# Patient Record
Sex: Female | Born: 1985 | Race: Black or African American | Hispanic: No | Marital: Single | Smoking: Never smoker
Health system: Southern US, Community
[De-identification: ages and names within clinical notes are randomized; demographics above are authoritative.]

## PROBLEM LIST (undated history)

## (undated) DIAGNOSIS — I10 Essential (primary) hypertension: Secondary | ICD-10-CM

---

## 2010-10-29 ENCOUNTER — Inpatient Hospital Stay (HOSPITAL_COMMUNITY)
Admission: AD | Admit: 2010-10-29 | Discharge: 2010-10-29 | Disposition: A | Discharge status: Home / Self Care | Payer: Medicare Other | Source: Ambulatory Visit | Attending: Obstetrics & Gynecology | Admitting: Obstetrics & Gynecology

## 2010-10-29 DIAGNOSIS — O99891 Other specified diseases and conditions complicating pregnancy: Secondary | ICD-10-CM | POA: Insufficient documentation

## 2010-10-29 LAB — URINALYSIS, ROUTINE W REFLEX MICROSCOPIC
Bilirubin Urine: NEGATIVE
Glucose, UA: NEGATIVE mg/dL
Ketones, ur: NEGATIVE mg/dL
Protein, ur: NEGATIVE mg/dL
Urobilinogen, UA: 0.2 mg/dL (ref 0.0–1.0)

## 2010-10-29 LAB — URINE MICROSCOPIC-ADD ON

## 2011-05-07 NOTE — L&D Delivery Note (Signed)
Delivery Note At 12:58 PM a viable and healthy female was delivered via Vaginal, Spontaneous Delivery (Presentation: Left Occiput Anterior).  APGAR: 8, 9; weight 7 lb 1.4 oz (3215 g).   Placenta status: Intact, Spontaneous.  Cord: 3 vessels with loose nuchal cord present.  Anesthesia: Epidural  Episiotomy:  Lacerations: 1st degree;Perineal and labial  Suture Repair: 3.0 chromic Est. Blood Loss (mL):   Mom to postpartum.  Baby to nursery-stable.  Wayland Baik D 06/28/2011, 2:50 PM

## 2011-06-27 ENCOUNTER — Inpatient Hospital Stay (HOSPITAL_COMMUNITY)
Admission: AD | Admit: 2011-06-27 | Discharge: 2011-06-30 | DRG: 775 | Disposition: A | Discharge status: Home / Self Care | Payer: 59 | Source: Ambulatory Visit | Attending: Obstetrics and Gynecology | Admitting: Obstetrics and Gynecology

## 2011-06-27 ENCOUNTER — Encounter (HOSPITAL_COMMUNITY): Payer: Self-pay | Admitting: *Deleted

## 2011-06-27 DIAGNOSIS — O4290 Premature rupture of membranes, unspecified as to length of time between rupture and onset of labor, unspecified weeks of gestation: Principal | ICD-10-CM | POA: Diagnosis present

## 2011-06-27 DIAGNOSIS — Z22330 Carrier of Group B streptococcus: Secondary | ICD-10-CM

## 2011-06-27 DIAGNOSIS — Z3400 Encounter for supervision of normal first pregnancy, unspecified trimester: Secondary | ICD-10-CM

## 2011-06-27 DIAGNOSIS — O99892 Other specified diseases and conditions complicating childbirth: Secondary | ICD-10-CM | POA: Diagnosis present

## 2011-06-27 LAB — CBC
HCT: 32.1 % — ABNORMAL LOW (ref 36.0–46.0)
Hemoglobin: 10.5 g/dL — ABNORMAL LOW (ref 12.0–15.0)
MCHC: 32.7 g/dL (ref 30.0–36.0)
MCV: 79.1 fL (ref 78.0–100.0)
RDW: 14.9 % (ref 11.5–15.5)

## 2011-06-27 LAB — GC/CHLAMYDIA PROBE AMP, GENITAL: Chlamydia: NEGATIVE

## 2011-06-27 LAB — RUBELLA ANTIBODY, IGM: Rubella: IMMUNE

## 2011-06-27 LAB — ANTIBODY SCREEN: Antibody Screen: NEGATIVE

## 2011-06-27 LAB — HIV ANTIBODY (ROUTINE TESTING W REFLEX): HIV: NONREACTIVE

## 2011-06-27 LAB — ABO/RH: RH Type: POSITIVE

## 2011-06-27 MED ORDER — FLEET ENEMA 7-19 GM/118ML RE ENEM: 1.0000 | ENEMA | RECTAL | Status: DC | PRN

## 2011-06-27 MED ORDER — ACETAMINOPHEN 325 MG PO TABS: 650.0000 mg | ORAL_TABLET | ORAL | Status: DC | PRN

## 2011-06-27 MED ORDER — CITRIC ACID-SODIUM CITRATE 334-500 MG/5ML PO SOLN: 30.0000 mL | ORAL | Status: DC | PRN

## 2011-06-27 MED ORDER — LACTATED RINGERS IV SOLN: 500.0000 mL | INTRAVENOUS | Status: DC | PRN

## 2011-06-27 MED ORDER — LIDOCAINE HCL (PF) 1 % IJ SOLN
30.0000 mL | INTRAMUSCULAR | Status: DC | PRN
  Filled 2011-06-27: qty 30

## 2011-06-27 MED ORDER — EPHEDRINE 5 MG/ML INJ: 10.0000 mg | INTRAVENOUS | Status: DC | PRN

## 2011-06-27 MED ORDER — TERBUTALINE SULFATE 1 MG/ML IJ SOLN: 0.2500 mg | Freq: Once | INTRAMUSCULAR | Status: AC | PRN

## 2011-06-27 MED ORDER — DIPHENHYDRAMINE HCL 50 MG/ML IJ SOLN: 12.5000 mg | INTRAMUSCULAR | Status: DC | PRN

## 2011-06-27 MED ORDER — FENTANYL 2.5 MCG/ML BUPIVACAINE 1/10 % EPIDURAL INFUSION (WH - ANES)
14.0000 mL/h | INTRAMUSCULAR | Status: DC
  Administered 2011-06-28 (×4): 14 mL/h via EPIDURAL
  Filled 2011-06-27 (×5): qty 60

## 2011-06-27 MED ORDER — OXYTOCIN 20 UNITS IN LACTATED RINGERS INFUSION - SIMPLE: 125.0000 mL/h | Freq: Once | INTRAVENOUS | Status: DC

## 2011-06-27 MED ORDER — SODIUM CHLORIDE 0.9 % IJ SOLN: 3.0000 mL | INTRAMUSCULAR | Status: DC | PRN

## 2011-06-27 MED ORDER — PHENYLEPHRINE 40 MCG/ML (10ML) SYRINGE FOR IV PUSH (FOR BLOOD PRESSURE SUPPORT): 80.0000 ug | PREFILLED_SYRINGE | INTRAVENOUS | Status: DC | PRN

## 2011-06-27 MED ORDER — LACTATED RINGERS IV SOLN: 500.0000 mL | Freq: Once | INTRAVENOUS | Status: DC

## 2011-06-27 MED ORDER — OXYTOCIN 20 UNITS IN LACTATED RINGERS INFUSION - SIMPLE
1.0000 m[IU]/min | INTRAVENOUS | Status: DC
  Administered 2011-06-27: 2 m[IU]/min via INTRAVENOUS
  Administered 2011-06-28: 333 m[IU]/min via INTRAVENOUS
  Administered 2011-06-28: 4 m[IU]/min via INTRAVENOUS

## 2011-06-27 MED ORDER — OXYTOCIN BOLUS FROM INFUSION
500.0000 mL | Freq: Once | INTRAVENOUS | Status: DC
  Filled 2011-06-27: qty 500

## 2011-06-27 MED ORDER — PENICILLIN G POTASSIUM 5000000 UNITS IJ SOLR
2.5000 10*6.[IU] | INTRAVENOUS | Status: DC
  Administered 2011-06-28 (×4): 2.5 10*6.[IU] via INTRAVENOUS
  Filled 2011-06-27 (×8): qty 2.5

## 2011-06-27 MED ORDER — IBUPROFEN 600 MG PO TABS: 600.0000 mg | ORAL_TABLET | Freq: Four times a day (QID) | ORAL | Status: DC | PRN

## 2011-06-27 MED ORDER — EPHEDRINE 5 MG/ML INJ
10.0000 mg | INTRAVENOUS | Status: DC | PRN
  Filled 2011-06-27: qty 4

## 2011-06-27 MED ORDER — OXYCODONE-ACETAMINOPHEN 5-325 MG PO TABS: 1.0000 | ORAL_TABLET | ORAL | Status: DC | PRN

## 2011-06-27 MED ORDER — OXYTOCIN 20 UNITS IN LACTATED RINGERS INFUSION - SIMPLE
INTRAVENOUS | Status: AC
  Administered 2011-06-27: 2 m[IU]/min via INTRAVENOUS
  Filled 2011-06-27: qty 1000

## 2011-06-27 MED ORDER — LACTATED RINGERS IV SOLN
INTRAVENOUS | Status: DC
  Administered 2011-06-28: 09:00:00 via INTRAVENOUS

## 2011-06-27 MED ORDER — PENICILLIN G POTASSIUM 5000000 UNITS IJ SOLR
5.0000 10*6.[IU] | Freq: Once | INTRAVENOUS | Status: AC
  Administered 2011-06-27: 5 10*6.[IU] via INTRAVENOUS
  Filled 2011-06-27: qty 5

## 2011-06-27 MED ORDER — ONDANSETRON HCL 4 MG/2ML IJ SOLN: 4.0000 mg | Freq: Four times a day (QID) | INTRAMUSCULAR | Status: DC | PRN

## 2011-06-27 MED ORDER — PHENYLEPHRINE 40 MCG/ML (10ML) SYRINGE FOR IV PUSH (FOR BLOOD PRESSURE SUPPORT)
80.0000 ug | PREFILLED_SYRINGE | INTRAVENOUS | Status: DC | PRN
  Filled 2011-06-27: qty 5

## 2011-06-28 ENCOUNTER — Encounter (HOSPITAL_COMMUNITY): Payer: Self-pay | Admitting: Anesthesiology

## 2011-06-28 ENCOUNTER — Inpatient Hospital Stay (HOSPITAL_COMMUNITY): Payer: 59 | Admitting: Anesthesiology

## 2011-06-28 ENCOUNTER — Encounter (HOSPITAL_COMMUNITY): Payer: Self-pay | Admitting: *Deleted

## 2011-06-28 MED ORDER — LANOLIN HYDROUS EX OINT: TOPICAL_OINTMENT | CUTANEOUS | Status: DC | PRN

## 2011-06-28 MED ORDER — BENZOCAINE-MENTHOL 20-0.5 % EX AERO
1.0000 "application " | INHALATION_SPRAY | CUTANEOUS | Status: DC | PRN
  Administered 2011-06-28: 1 via TOPICAL

## 2011-06-28 MED ORDER — PRENATAL MULTIVITAMIN CH
1.0000 | ORAL_TABLET | Freq: Every day | ORAL | Status: DC
  Administered 2011-06-29 – 2011-06-30 (×2): 1 via ORAL
  Filled 2011-06-28 (×2): qty 1

## 2011-06-28 MED ORDER — DIBUCAINE 1 % RE OINT: 1.0000 "application " | TOPICAL_OINTMENT | RECTAL | Status: DC | PRN

## 2011-06-28 MED ORDER — WITCH HAZEL-GLYCERIN EX PADS: 1.0000 "application " | MEDICATED_PAD | CUTANEOUS | Status: DC | PRN

## 2011-06-28 MED ORDER — BENZOCAINE-MENTHOL 20-0.5 % EX AERO
INHALATION_SPRAY | CUTANEOUS | Status: AC
  Administered 2011-06-28: 1 via TOPICAL
  Filled 2011-06-28: qty 56

## 2011-06-28 MED ORDER — ZOLPIDEM TARTRATE 5 MG PO TABS: 5.0000 mg | ORAL_TABLET | Freq: Every evening | ORAL | Status: DC | PRN

## 2011-06-28 MED ORDER — SENNOSIDES-DOCUSATE SODIUM 8.6-50 MG PO TABS
2.0000 | ORAL_TABLET | Freq: Every day | ORAL | Status: DC
  Administered 2011-06-28 – 2011-06-29 (×2): 2 via ORAL

## 2011-06-28 MED ORDER — DIPHENHYDRAMINE HCL 25 MG PO CAPS: 25.0000 mg | ORAL_CAPSULE | Freq: Four times a day (QID) | ORAL | Status: DC | PRN

## 2011-06-28 MED ORDER — IBUPROFEN 600 MG PO TABS
600.0000 mg | ORAL_TABLET | Freq: Four times a day (QID) | ORAL | Status: DC
  Administered 2011-06-28 – 2011-06-30 (×8): 600 mg via ORAL
  Filled 2011-06-28 (×8): qty 1

## 2011-06-28 MED ORDER — LACTATED RINGERS IV SOLN
INTRAVENOUS | Status: DC
  Administered 2011-06-28: 09:00:00 via INTRAUTERINE

## 2011-06-28 MED ORDER — OXYCODONE-ACETAMINOPHEN 5-325 MG PO TABS: 1.0000 | ORAL_TABLET | ORAL | Status: DC | PRN

## 2011-06-28 MED ORDER — ONDANSETRON HCL 4 MG/2ML IJ SOLN: 4.0000 mg | INTRAMUSCULAR | Status: DC | PRN

## 2011-06-28 MED ORDER — SIMETHICONE 80 MG PO CHEW: 80.0000 mg | CHEWABLE_TABLET | ORAL | Status: DC | PRN

## 2011-06-28 MED ORDER — TETANUS-DIPHTH-ACELL PERTUSSIS 5-2.5-18.5 LF-MCG/0.5 IM SUSP: 0.5000 mL | Freq: Once | INTRAMUSCULAR | Status: DC

## 2011-06-28 MED ORDER — ONDANSETRON HCL 4 MG PO TABS: 4.0000 mg | ORAL_TABLET | ORAL | Status: DC | PRN

## 2011-06-28 MED ORDER — LIDOCAINE HCL (PF) 1 % IJ SOLN
INTRAMUSCULAR | Status: DC | PRN
  Administered 2011-06-28 (×2): 5 mL

## 2011-06-28 NOTE — Progress Notes (Signed)
Cervix 8cm.  Vtx 0 station with caput.  Variable decels, good variability.  Will continue TOL with pitocin.  IUPC placed.

## 2011-06-28 NOTE — Anesthesia Procedure Notes (Signed)
Epidural Patient location during procedure: OB Start time: 06/28/2011 12:26 AM  Staffing Anesthesiologist: Brayton Caves R Performed by: anesthesiologist   Preanesthetic Checklist Completed: patient identified, site marked, surgical consent, pre-op evaluation, timeout performed, IV checked, risks and benefits discussed and monitors and equipment checked  Epidural Patient position: sitting Prep: site prepped and draped and DuraPrep Patient monitoring: continuous pulse ox and blood pressure Approach: midline Injection technique: LOR air and LOR saline  Needle:  Needle type: Tuohy  Needle gauge: 17 G Needle length: 9 cm Needle insertion depth: 5 cm cm Catheter type: closed end flexible Catheter size: 19 Gauge Catheter at skin depth: 10 cm Test dose: negative  Assessment Events: blood not aspirated, injection not painful, no injection resistance, negative IV test and no paresthesia  Additional Notes Patient identified.  Risk benefits discussed including failed block, incomplete pain control, headache, nerve damage, paralysis, blood pressure changes, nausea, vomiting, reactions to medication both toxic or allergic, and postpartum back pain.  Patient expressed understanding and wished to proceed.  All questions were answered.  Sterile technique used throughout procedure and epidural site dressed with sterile barrier dressing. No paresthesia or other complications noted.The patient did not experience any signs of intravascular injection such as tinnitus or metallic taste in mouth nor signs of intrathecal spread such as rapid motor block. Please see nursing notes for vital signs.

## 2011-06-28 NOTE — Anesthesia Preprocedure Evaluation (Addendum)

## 2011-06-28 NOTE — H&P (Signed)
  Cc: Ctx/LOF HPI: 26 yo g1 @ 39+3 was seen in office earlier today c/o LOF and was confirmed SROM with + fern. Pt uncertain when SROM occurred. Also having irregular ctx. cvx 3/60/-2 per office exam. Pregnancy uncomplicated to this point. PMH: None PSH: None POBGYN: G1P0. ASCUS pap, h/o condyloma PNL: O pos, Ab neg, HIV NR, HepBsAg neg, RPR NR, generic screening WNL. GC neg, Ch neg. GBS pos PE: AFVSS cvx 2-3/60/-2 fht 140 reactive toco irreg  AP Admit PCN for GBS Pit for augmentation Epidural on request

## 2011-06-28 NOTE — Anesthesia Postprocedure Evaluation (Signed)
  Anesthesia Post-op Note  Patient: Diana Walker  Procedure(s) Performed: * No procedures listed *  Patient Location: Mother/Baby  Anesthesia Type: Epidural  Level of Consciousness: alert  and oriented  Airway and Oxygen Therapy: Patient Spontanous Breathing  Post-op Pain: mild  Post-op Assessment: Patient's Cardiovascular Status Stable and Respiratory Function Stable  Post-op Vital Signs: stable  Complications: No apparent anesthesia complications

## 2011-06-29 LAB — CBC
MCV: 80.6 fL (ref 78.0–100.0)
Platelets: 203 10*3/uL (ref 150–400)
RBC: 3.09 MIL/uL — ABNORMAL LOW (ref 3.87–5.11)
WBC: 17.3 10*3/uL — ABNORMAL HIGH (ref 4.0–10.5)

## 2011-06-29 NOTE — Progress Notes (Signed)
Post Partum Day 1 Subjective: no complaints  Objective: Blood pressure 138/82, pulse 101, temperature 98.7 F not breastfeeding.  Physical Exam:  General: alert Lochia: appropriate Uterine Fundus: firm   Basename 06/29/11 0515 06/27/11 2000  HGB 8.1* 10.5*  HCT 24.9* 32.1*    Assessment/Plan: Plan for discharge tomorrow   LOS: 2 days   Tejas Seawood D 06/29/2011, 10:41 AM

## 2011-06-30 NOTE — Discharge Instructions (Signed)
Postpartum Care After Vaginal Delivery  After you deliver your baby, you will stay in the hospital for 24 to 72 hours, unless there were problems with the labor or delivery, or you have medical problems. While you are in the hospital, you will receive help and instructions on how to care for yourself and your baby.  Your doctor will order pain medicine, in case you need it. You will have a small amount of bleeding from your vagina and should change your sanitary pad frequently. Wash your hands thoroughly with soap and water for at least 20 seconds after changing pads and using the toilet. Let the nurses know if you begin to pass blood clots or your bleeding increases. Do not flush blood clots down the toilet before having the nurse look at them, to make sure there is no placental tissue with them.  If you had an intravenous (IV), it will be removed within 24 hours, if there are no problems. The first time you get out of bed or take a shower, call the nurse to help you because you may get weak, lightheaded, or even faint. If you are breastfeeding, you may feel painful contractions of your uterus for a couple of weeks. This is normal. The contractions help your uterus get back to normal size. If you are not breastfeeding, wear a supportive bra and handle your breasts as little as possible until your milk has dried up. Hormones should not be given to dry up the breasts, because they can cause blood clots. You will be given your normal diet, unless you have diabetes or other medical problems.   The nurses may put an ice pack on your episiotomy (surgically enlarged opening), if you have one, to reduce the pain and swelling. On rare occasions, you may not be able to urinate and the nurse will need to empty your bladder with a catheter. If you had a postpartum tubal ligation ("tying tubes," female sterilization), it should not make your stay in the hospital longer.  You may have your baby in your room with you as much as  you like, unless you or the baby has a problem. Use the bassinet (basket) for the baby when going to and from the nursery. Do not carry the baby. Do not leave the postpartum area. If the mother is Rh negative (lacks a protein on the red blood cells) and the baby is Rh positive, the mother should get a Rho-gam shot to prevent Rh problems with future pregnancies.  You may be given written instructions for you and your baby, and necessary medicines, when you are discharged from the hospital. Be sure you understand and follow the instructions as advised.  HOME CARE INSTRUCTIONS   · Follow instructions and take the medicines given to you.   · Only take over-the-counter or prescription medicines for pain, discomfort, or fever as directed by your caregiver.   · Do not take aspirin, because it can cause bleeding.   · Increase your activities a little bit every day to build up your strength and endurance.   · Do not drink alcohol, especially if you are breastfeeding or taking pain medicine.   · Take your temperature twice a day and record it.   · You may have a small amount of bleeding or spotting for 2 to 4 weeks. This is normal.   · Do not use tampons or douche. Use sanitary pads.   · Try to have someone stay and help you for a   few days when you go home.   · Try to rest or take a nap when the baby is sleeping.   · If you are breastfeeding, wear a good support bra. If you are not breastfeeding, wear a supportive bra and do not stimulate your nipples.   · Eat a healthy, nutritious diet and continue to take your prenatal vitamins.   · Do not drive, do any heavy activities, or travel until your caregiver tells you it is okay.   · Do not have intercourse until your caregiver gives you permission to do so.   · Ask your caregiver when you can begin to exercise and what type of exercises to do.   · Call your caregiver if you think you are having a problem from your delivery.   · Call your pediatrician if you are having a problem  with the baby.   · Schedule your postpartum visit and keep it.   SEEK MEDICAL CARE IF:   · You have a temperature of 100° F (37.8° C) or higher.   · You have increased vaginal bleeding or are passing clots. Save any clots to show your caregiver.   · You have bloody urine or pain when you urinate.   · You have a bad smelling vaginal discharge.   · You have increasing pain or swelling on your episiotomy.   · You develop a severe headache.   · You feel depressed.   · The episiotomy is separating.   · You become dizzy or lightheaded.   · You develop a rash.   · You have a reaction or problems with your medicine.   · You have pain, redness, or swelling at the intravenous site.   SEEK IMMEDIATE MEDICAL CARE IF:   · You have chest pain.   · You develop shortness of breath.   · You pass out.   · You develop pain, with or without swelling or redness in your leg.   · You develop heavy vaginal bleeding, with or without blood clots.   · You develop stomach pain.   · You develop a bad smelling vaginal discharge.   MAKE SURE YOU:   · Understand these instructions.   · Will watch your condition.   · Will get help right away if you are not doing well or get worse.   Document Released: 02/17/2007 Document Revised: 01/02/2011 Document Reviewed: 03/01/2009  [employer]® Patient Information ©2012 [employer].

## 2011-06-30 NOTE — Discharge Summary (Signed)
Obstetric Discharge Summary Reason for Admission: induction of labor, PROM Prenatal Procedures: ultrasound Intrapartum Procedures: spontaneous vaginal delivery Postpartum Procedures: none Complications-Operative and Postpartum: 1st degree perineal and labia minora lacerations Hemoglobin  Date Value Range Status  06/29/2011 8.1* 12.0-15.0 (g/dL) Final     DELTA CHECK NOTED     REPEATED TO VERIFY     HCT  Date Value Range Status  06/29/2011 24.9* 36.0-46.0 (%) Final    Discharge Diagnoses: Term Pregnancy-delivered and PROM xuncertain hours  Discharge Information: Date: 06/30/2011 Activity: pelvic rest Diet: routine Medications: PNV and Ibuprofen Condition: improved Instructions: refer to practice specific booklet Discharge to: home Follow-up Information    Follow up with Mickel Baas, MD in 5 weeks.   Contact information:   [address] [place] [address] [phone]          Newborn Data: Live born female  Birth Weight: 7 lb 1.4 oz (3215 g) APGAR: 8, 9  Home with mother.  Kamia Insalaco D 06/30/2011, 9:17 AM

## 2011-06-30 NOTE — Progress Notes (Signed)
Post Partum Day 2 Subjective: no complaints  Objective: Blood pressure 124/72, pulse 92, temperature 97.6 F (36.4 C), temperature source Oral, resp. rate 18, height 5\' 6"  (1.676 m), weight 195 lb (88.451 kg), SpO2 99.00%, unknown if currently breastfeeding.  Physical Exam:  General: alert Lochia: appropriate Uterine Fundus: firm   Basename 06/29/11 0515 06/27/11 2000  HGB 8.1* 10.5*  HCT 24.9* 32.1*    Assessment/Plan: Discharge home   LOS: 3 days   Vanissa Strength D 06/30/2011, 9:07 AM

## 2011-07-29 ENCOUNTER — Emergency Department (HOSPITAL_COMMUNITY): Payer: 59

## 2011-07-29 ENCOUNTER — Emergency Department (HOSPITAL_COMMUNITY)
Admission: EM | Admit: 2011-07-29 | Discharge: 2011-07-30 | Disposition: A | Discharge status: Home / Self Care | Payer: 59 | Attending: Emergency Medicine | Admitting: Emergency Medicine

## 2011-07-29 ENCOUNTER — Encounter (HOSPITAL_COMMUNITY): Payer: Self-pay | Admitting: *Deleted

## 2011-07-29 DIAGNOSIS — S8990XA Unspecified injury of unspecified lower leg, initial encounter: Secondary | ICD-10-CM | POA: Insufficient documentation

## 2011-07-29 DIAGNOSIS — S83006A Unspecified dislocation of unspecified patella, initial encounter: Secondary | ICD-10-CM | POA: Insufficient documentation

## 2011-07-29 DIAGNOSIS — R269 Unspecified abnormalities of gait and mobility: Secondary | ICD-10-CM | POA: Insufficient documentation

## 2011-07-29 DIAGNOSIS — S99929A Unspecified injury of unspecified foot, initial encounter: Secondary | ICD-10-CM | POA: Insufficient documentation

## 2011-07-29 DIAGNOSIS — M25569 Pain in unspecified knee: Secondary | ICD-10-CM | POA: Insufficient documentation

## 2011-07-29 DIAGNOSIS — X500XXA Overexertion from strenuous movement or load, initial encounter: Secondary | ICD-10-CM | POA: Insufficient documentation

## 2011-07-29 MED ORDER — HYDROMORPHONE HCL PF 1 MG/ML IJ SOLN
1.0000 mg | Freq: Once | INTRAMUSCULAR | Status: AC
  Administered 2011-07-29: 1 mg via INTRAVENOUS
  Filled 2011-07-29: qty 1

## 2011-07-29 MED ORDER — IBUPROFEN 600 MG PO TABS: 600.0000 mg | ORAL_TABLET | Freq: Four times a day (QID) | ORAL | Status: AC | PRN

## 2011-07-29 MED ORDER — ONDANSETRON HCL 4 MG/2ML IJ SOLN
4.0000 mg | Freq: Once | INTRAMUSCULAR | Status: AC
  Administered 2011-07-29: 4 mg via INTRAVENOUS
  Filled 2011-07-29: qty 2

## 2011-07-29 MED ORDER — KETOROLAC TROMETHAMINE 30 MG/ML IJ SOLN
30.0000 mg | Freq: Once | INTRAMUSCULAR | Status: AC
  Administered 2011-07-29: 30 mg via INTRAVENOUS
  Filled 2011-07-29: qty 1

## 2011-07-29 MED ORDER — HYDROCODONE-ACETAMINOPHEN 5-325 MG PO TABS: 1.0000 | ORAL_TABLET | ORAL | Status: AC | PRN

## 2011-07-29 NOTE — Discharge Instructions (Signed)
Patellar Dislocation In a dislocation of the patella, the kneecap has slipped out of the groove it rides in on the front of the knee. It can usually be guided back into its normal position without much difficulty. Often it goes back into position by straightening the leg. X-rays may be taken to make sure a fracture (bone break) is not present. Your caregiver may look into your knee joint with an instrument much like a pencil sized telescope(arthroscopy).This may be done to make sure other problems are not present. Other problems include loose cartilage bodies that are not visible on x-rays. Often nothing more may be needed other than a brief period of immobilization followed by the exercises your caregiver recommends. If this becomes a recurrent (happens several times) and chronic (long lasting) problem, surgery (an operation) may be needed to prevent this. LET YOUR CAREGIVERS KNOW ABOUT:  Allergies.   Medications taken including herbs, eye drops, over the counter medications, and creams.   Use of steroids (by mouth or creams).   Previous problems with anesthetics or novocaine   Tendency toward highly flexible joints.   Possibility of pregnancy, if this applies.   History of blood clots (thrombophlebitis).   History of bleeding or blood problems.   Previous surgery.   Other health problems.   History of anesthetic problems in your family.  HOME CARE INSTRUCTIONS   You may resume normal diet and activities as directed or allowed.   Only take over-the-counter or prescription medicines for pain, discomfort, or fever as directed by your caregiver.   Use crutches as instructed.   Apply ice to the injured knee for 15 to 20 minutes, 3 to 4 times per day while awake, for 2 days. Put the ice in a plastic bag and place a thin towel between the bag of ice and your cast, splint or wrap.   If your caregiver has given you instructions for exercises and range of motion to do, make sure to do them  as instructed. This helps prevent future recurrences. Use the brace or knee immobilizer as instructed.  SEEK IMMEDIATE MEDICAL CARE IF:  There is increased pain or swelling of the knee which is not relieved with medication.   There is increasing inflammation (warmth or redness) in the knee.   There are problems with continued dislocations.   There is locking or catching of your knee.  Document Released: 01/15/2001 Document Revised: 04/11/2011 Document Reviewed: 05/09/2008 [facility] Patient Information 2012 [place], [place].   Use ibuprofen as prescribed for the next 5 days.  You may use hydrocodone as needed for increased pain relief, however do not drive within 4 hours of taking this as it will cause drowsiness.  With the knee immobilizer at all times while awake to avoid flexing the knee and potentially causing another patellar dislocation.  Call Dr. August Saucer for recheck of your injury as mentioned above.  An ice pack applied and 10 minute increments throughout the day may also help reduce pain and swelling.

## 2011-07-29 NOTE — ED Provider Notes (Signed)
History     CSN: [id number]  Arrival date & time 07/29/11  2049   First MD Initiated Contact with Patient 07/29/11 2053      Chief Complaint  Patient presents with  . Knee Injury    (Consider location/radiation/quality/duration/timing/severity/associated sxs/prior treatment) HPI Comments: Patient was standing in her bathroom this evening when she bent and twisted her right leg at the same time, felt a sudden pop, severe pain and collapsed to the floor with obvious deformity to the right knee.  Patient is a 26 y.o. female presenting with leg pain. The history is provided by the patient.  Leg Pain  The incident occurred less than 1 hour ago. The incident occurred at home. The injury mechanism was torsion. The pain is present in the right knee (She denies any other injury.  She did not hit her head and denies neck pain.). The quality of the pain is described as sharp and aching. The pain is at a severity of 10/10. The pain is severe. The pain has been constant since onset. Associated symptoms include inability to bear weight. Pertinent negatives include no numbness, no loss of sensation and no tingling. The symptoms are aggravated by activity and palpation. She has tried immobilization for the symptoms. The treatment provided no relief.    History reviewed. No pertinent past medical history.  History reviewed. No pertinent past surgical history.  History reviewed. No pertinent family history.  History  Substance Use Topics  . Smoking status: Never Smoker   . Smokeless tobacco: Never Used  . Alcohol Use: No    OB History    Grav Para Term Preterm Abortions TAB SAB Ect Mult Living   1 1 1  0 0 0 0 0 0 1      Review of Systems  Constitutional: Negative for fever.  HENT: Negative for congestion, sore throat and neck pain.   Eyes: Negative.   Respiratory: Negative for chest tightness and shortness of breath.   Cardiovascular: Negative for chest pain.  Gastrointestinal: Negative  for nausea and abdominal pain.  Genitourinary: Negative.   Musculoskeletal: Positive for arthralgias and gait problem.  Skin: Negative.  Negative for rash and wound.  Neurological: Negative for dizziness, tingling, weakness, light-headedness, numbness and headaches.  Hematological: Negative.   Psychiatric/Behavioral: Negative.     Allergies  Review of patient's allergies indicates no known allergies.  Home Medications   Current Outpatient Rx  Name Route Sig Dispense Refill  . HYDROCODONE-ACETAMINOPHEN 5-325 MG PO TABS Oral Take 1 tablet by mouth every 4 (four) hours as needed for pain. 15 tablet 0  . IBUPROFEN 600 MG PO TABS Oral Take 1 tablet (600 mg total) by mouth every 6 (six) hours as needed for pain. 20 tablet 0    BP 136/73  Pulse 68  Temp(Src) 98.9 F (37.2 C) (Oral)  Resp 18  SpO2 98%  Breastfeeding? No  Physical Exam  Nursing note and vitals reviewed. Constitutional: She is oriented to person, place, and time. She appears well-developed and well-nourished.  HENT:  Head: Normocephalic.  Eyes: Conjunctivae are normal.  Neck: Normal range of motion.  Cardiovascular: Normal rate and intact distal pulses.  Exam reveals no decreased pulses.   Pulses:      Dorsalis pedis pulses are 2+ on the right side.       Posterior tibial pulses are 2+ on the right side.  Pulmonary/Chest: Effort normal.  Musculoskeletal: She exhibits tenderness.       Right knee: She exhibits decreased  range of motion and deformity. She exhibits no effusion and no erythema. tenderness found. Lateral joint line tenderness noted.       Obvious right knee lateral deformity with dislocation of patella.   Neurological: She is alert and oriented to person, place, and time. No sensory deficit.  Skin: Skin is warm, dry and intact.    ED Course  Procedures (including critical care time)  Labs Reviewed - No data to display Dg Knee Complete 4 Views Right  07/29/2011  *RADIOLOGY REPORT*  Clinical Data:  26 year old female with right knee pain following injury.  RIGHT KNEE - COMPLETE 4+ VIEW  Comparison: None  Findings: Lateral patellar dislocation is noted. A knee effusion is present. Mild cortical deformity along the lateral femoral condyle may represent an impaction fracture. No other abnormalities identified.  IMPRESSION: Lateral patellar dislocation with possible lateral femoral condylar impaction fracture.  Small knee effusion.  Original Report Authenticated By: Rosendo Gros, M.D.     1. Patellar dislocation     Patient was given IV Dilaudid 1 mg x2, Toradol 30 mg IV x1.  Patella was reduced by extension of right knee and gentle pressure applied to patella.  Relocated easily.  Pain improved.  Knee immobilizer placed.   MDM  Patellar dislocation.        Candis Musa, PA 07/29/11 2347  Candis Musa, PA 07/29/11 [phone]

## 2011-07-29 NOTE — ED Notes (Signed)
[facility]: pt was in bathroom twisted and heard a pop from her knee. Pt then lowered herself to the ground. Pt did not hit head or pass out. Pt has obvious deformity to right knee patella is twisted laterally. Pt given of fentanyl in route and has a 20 LAC. Pt leg splinted and CNS intact below injury. Pt remains alert and oriented.

## 2011-07-30 MED ORDER — HYDROCODONE-ACETAMINOPHEN 5-325 MG PO TABS
ORAL_TABLET | ORAL | Status: AC
  Administered 2011-07-30: 1
  Filled 2011-07-30: qty 1

## 2011-07-31 NOTE — ED Provider Notes (Signed)
Medical screening examination/treatment/procedure(s) were performed by non-physician practitioner and as supervising physician I was immediately available for consultation/collaboration.   Carleene Cooper III, MD 07/31/11 [phone]

## 2012-03-04 ENCOUNTER — Ambulatory Visit: Payer: Medicare Other | Admitting: Medical

## 2012-03-06 ENCOUNTER — Ambulatory Visit (INDEPENDENT_AMBULATORY_CARE_PROVIDER_SITE_OTHER): Payer: 59 | Admitting: Family Medicine

## 2012-03-06 ENCOUNTER — Encounter: Payer: Self-pay | Admitting: Family Medicine

## 2012-03-06 VITALS — BP 128/80 | HR 66 | Ht 66.5 in | Wt 168.0 lb

## 2012-03-06 DIAGNOSIS — R079 Chest pain, unspecified: Secondary | ICD-10-CM

## 2012-03-06 DIAGNOSIS — R0781 Pleurodynia: Secondary | ICD-10-CM

## 2012-03-06 DIAGNOSIS — J069 Acute upper respiratory infection, unspecified: Secondary | ICD-10-CM

## 2012-03-06 NOTE — Progress Notes (Signed)
  Subjective:    Patient ID: Diana Walker, female    DOB: [birth year], 26 y.o.   MRN: [id number]  HPI She was seen in an urgent care approximately 9 days ago for evaluation of shortness of breath and chest pain. Chest x-ray, EKG and blood work was negative. Se was given Naprosyn. She is here for recheck. She still having some right-sided rib pain but rates it a 2/10.   Review of Systems     Objective:   Physical Exam alert and in no distress. Tympanic membranes and canals are normal. Throat is clear. Tonsils are normal. Neck is supple without adenopathy or thyromegaly. Cardiac exam shows a regular sinus rhythm without murmurs or gallops. Lungs are clear to auscultation. No chest wall tenderness. No right upper quadrant pain on palpation.        Assessment & Plan:   1. URI (upper respiratory infection)   2. Rib pain on right side    recommend she use Aleve 2 pills twice per day. If she continues to have difficulty, she is to return here for reevaluation.

## 2012-03-06 NOTE — Patient Instructions (Signed)
Switch to Aleve 2 pills twice per day to help with the discomfort

## 2012-03-12 ENCOUNTER — Encounter: Payer: Self-pay | Admitting: Family Medicine

## 2014-03-07 ENCOUNTER — Encounter: Payer: Self-pay | Admitting: Family Medicine

## 2015-12-16 ENCOUNTER — Ambulatory Visit (HOSPITAL_COMMUNITY)
Admission: EM | Admit: 2015-12-16 | Discharge: 2015-12-16 | Disposition: A | Discharge status: Home / Self Care | Payer: 59 | Attending: Family Medicine | Admitting: Family Medicine

## 2015-12-16 ENCOUNTER — Encounter (HOSPITAL_COMMUNITY): Payer: Self-pay

## 2015-12-16 DIAGNOSIS — R109 Unspecified abdominal pain: Secondary | ICD-10-CM | POA: Insufficient documentation

## 2015-12-16 DIAGNOSIS — Z202 Contact with and (suspected) exposure to infections with a predominantly sexual mode of transmission: Secondary | ICD-10-CM | POA: Diagnosis present

## 2015-12-16 LAB — POCT URINALYSIS DIP (DEVICE)
Bilirubin Urine: NEGATIVE
GLUCOSE, UA: NEGATIVE mg/dL
KETONES UR: NEGATIVE mg/dL
Leukocytes, UA: NEGATIVE
NITRITE: NEGATIVE
PROTEIN: NEGATIVE mg/dL
Specific Gravity, Urine: 1.02 (ref 1.005–1.030)
UROBILINOGEN UA: 1 mg/dL (ref 0.0–1.0)
pH: 7.5 (ref 5.0–8.0)

## 2015-12-16 LAB — POCT PREGNANCY, URINE: PREG TEST UR: NEGATIVE

## 2015-12-16 MED ORDER — METRONIDAZOLE 500 MG PO TABS
500.0000 mg | ORAL_TABLET | Freq: Two times a day (BID) | ORAL | 0 refills | Status: DC
Start: 2015-12-16 — End: 2016-02-13

## 2015-12-16 NOTE — ED Triage Notes (Signed)
Patient presents with LLQ/LRQ abdominal pain x1 week, no pressure or burning during urination, just generalized lower abdominal pain. Pt has taken Ibuprofen for pain las time taken was last night 12/15/2015. No acute distress

## 2015-12-16 NOTE — ED Provider Notes (Signed)
[facility]    CSN: [id number] Arrival date & time: 12/16/15  1428  First Provider Contact:  First MD Initiated Contact with Patient 12/16/15 1527        History   Chief Complaint Chief Complaint  Patient presents with  . Abdominal Pain    HPI Diana Walker is a 30 y.o. female.   HPI Patient presents with the above. States that she has had abdominal cramping for the last couple of weeks.  Started her period 2 august and had usual cramping but this has continued after cycle finished.  She states that she has not been sexually active for about 7 months but just had a new partner around the time of the onset of cramping.  Has had a foul vaginal odor but denies discharge, dysuria, hematuria, vaginal bleeding, fever, chills, nausea, vomiting, bowel changes.  States that she has been treated for bacterial vaginosis in the past after having sexual partners.  Not sure if something is wrong with my "pH".   History reviewed. No pertinent past medical history.  Patient Active Problem List   Diagnosis Date Noted  . Possible exposure to STD 12/16/2015    History reviewed. No pertinent surgical history.  OB History    Gravida Para Term Preterm AB Living   1 1 1  0 0 1   SAB TAB Ectopic Multiple Live Births   0 0 0 0 1       Home Medications    Prior to Admission medications   Medication Sig Start Date End Date Taking? Authorizing Provider  Norgestimate-Ethinyl Estradiol Triphasic (ORTHO TRI-CYCLEN LO) 0.18/0.215/0.25 MG-25 MCG tab Take 1 tablet by mouth daily.   Yes Historical Provider, MD  metroNIDAZOLE (FLAGYL) 500 MG tablet Take 1 tablet (500 mg total) by mouth 2 (two) times daily. 12/16/15   Naida SleightJames M Ciro Tashiro, PA-C    Family History History reviewed. No pertinent family history.  Social History Social History  Substance Use Topics  . Smoking status: Never Smoker  . Smokeless tobacco: Never Used  . Alcohol use No     Allergies   Review of patient's allergies  indicates no known allergies.   Review of Systems Review of Systems  Constitutional: Negative.   HENT: Negative.   Eyes: Negative.   Respiratory: Negative.   Cardiovascular: Negative.   Gastrointestinal: Negative.   Endocrine: Negative.   Genitourinary: Positive for dyspareunia and pelvic pain. Negative for dysuria, flank pain, frequency, genital sores, hematuria, menstrual problem, vaginal bleeding, vaginal discharge and vaginal pain.  Musculoskeletal: Negative.   Neurological: Negative.   Hematological: Negative.   Psychiatric/Behavioral: Negative.      Physical Exam Triage Vital Signs ED Triage Vitals  Enc Vitals Group     BP 12/16/15 1457 138/73     Pulse Rate 12/16/15 1457 80     Resp 12/16/15 1457 17     Temp 12/16/15 1457 98.1 F (36.7 C)     Temp Source 12/16/15 1457 Oral     SpO2 12/16/15 1457 100 %     Weight --      Height --      Head Circumference --      Peak Flow --      Pain Score 12/16/15 1500 4     Pain Loc --      Pain Edu? --      Excl. in GC? --    No data found.   Updated Vital Signs BP 138/73 (BP Location: Left Arm)  Pulse 80   Temp 98.1 F (36.7 C) (Oral)   Resp 17   LMP 12/06/2015 (Exact Date)   SpO2 100%   Visual Acuity Right Eye Distance:   Left Eye Distance:   Bilateral Distance:    Right Eye Near:   Left Eye Near:    Bilateral Near:     Physical Exam  Constitutional: She is oriented to person, place, and time. No distress.  HENT:  Head: Normocephalic and atraumatic.  Eyes: EOM are normal. Pupils are equal, round, and reactive to light.  Cardiovascular: Normal rate, regular rhythm and normal heart sounds.   Pulmonary/Chest: Effort normal.  Abdominal: She exhibits no distension and no mass. There is no tenderness. There is no rebound and no guarding.  Genitourinary:  Genitourinary Comments: Vaginal exam performed with assistant present.  There is a milky white discharge noted.  Cultures obtained.   Musculoskeletal:  Normal range of motion.  Neurological: She is oriented to person, place, and time.  Skin: Skin is warm and dry.  Psychiatric: She has a normal mood and affect.     UC Treatments / Results  Labs (all labs ordered are listed, but only abnormal results are displayed) Labs Reviewed  POCT URINALYSIS DIP (DEVICE) - Abnormal; Notable for the following:       Result Value   Hgb urine dipstick SMALL (*)    All other components within normal limits  POCT PREGNANCY, URINE  CERVICOVAGINAL ANCILLARY ONLY    EKG  EKG Interpretation None       Radiology No results found.  Procedures Procedures (including critical care time)  Medications Ordered in UC Medications - No data to display   Initial Impression / Assessment and Plan / UC Course  I have reviewed the triage vital signs and the nursing notes.  Pertinent labs & imaging results that were available during my care of the patient were reviewed by me and considered in my medical decision making (see chart for details).  Clinical Course      Final Clinical Impressions(s) / UC Diagnoses   Final diagnoses:  Possible exposure to STD    Plan: Since patient states that she has been treated for BV in the past after intercourse she was given a script for flagyl  po bid x 7 days.  We will contact her with her lab results and change treatment plan if needed.  All questions answered.      Naida Sleight, PA-C 12/16/15 [phone]

## 2015-12-18 LAB — CERVICOVAGINAL ANCILLARY ONLY
CHLAMYDIA, DNA PROBE: NEGATIVE
Neisseria Gonorrhea: NEGATIVE

## 2015-12-19 LAB — CERVICOVAGINAL ANCILLARY ONLY: WET PREP (BD AFFIRM): POSITIVE — AB

## 2015-12-20 ENCOUNTER — Telehealth (HOSPITAL_COMMUNITY): Payer: Self-pay | Admitting: Emergency Medicine

## 2015-12-20 NOTE — Telephone Encounter (Signed)
Per Dr. Dayton ScrapeMurray,  Notes Recorded by Eustace MooreLaura W Murray, MD on 12/19/2015 at 9:22 PM EDT Please let patient know that test for gardnerella (bacterial vaginosis) was positive.  [facility]  Recheck or followup with PCP, Sharlot GowdaJohn LaLonde, for further evaluation if symptoms persist. LM  Called pt and notified of recent lab results from visit 8/12 Pt ID'd properly... Reports feeling better and sx have subsided Adv pt if sx are not getting better to return Reports she only took 2 pills and stopped... Adv pt to make sure she finishes meds as Rx.  Education on safe sex given Pt verb understanding.

## 2016-02-13 ENCOUNTER — Ambulatory Visit (HOSPITAL_COMMUNITY)
Admission: EM | Admit: 2016-02-13 | Discharge: 2016-02-13 | Disposition: A | Discharge status: Home / Self Care | Payer: 59 | Attending: Emergency Medicine | Admitting: Emergency Medicine

## 2016-02-13 ENCOUNTER — Encounter (HOSPITAL_COMMUNITY): Payer: Self-pay | Admitting: *Deleted

## 2016-02-13 DIAGNOSIS — B9689 Other specified bacterial agents as the cause of diseases classified elsewhere: Secondary | ICD-10-CM | POA: Diagnosis not present

## 2016-02-13 DIAGNOSIS — N898 Other specified noninflammatory disorders of vagina: Secondary | ICD-10-CM | POA: Diagnosis present

## 2016-02-13 DIAGNOSIS — N760 Acute vaginitis: Secondary | ICD-10-CM | POA: Diagnosis not present

## 2016-02-13 DIAGNOSIS — Z8619 Personal history of other infectious and parasitic diseases: Secondary | ICD-10-CM | POA: Diagnosis not present

## 2016-02-13 LAB — POCT PREGNANCY, URINE: PREG TEST UR: NEGATIVE

## 2016-02-13 LAB — POCT URINALYSIS DIP (DEVICE)
BILIRUBIN URINE: NEGATIVE
Glucose, UA: NEGATIVE mg/dL
KETONES UR: NEGATIVE mg/dL
LEUKOCYTES UA: NEGATIVE
NITRITE: NEGATIVE
PH: 7 (ref 5.0–8.0)
Protein, ur: NEGATIVE mg/dL
Specific Gravity, Urine: 1.015 (ref 1.005–1.030)
Urobilinogen, UA: 0.2 mg/dL (ref 0.0–1.0)

## 2016-02-13 MED ORDER — METRONIDAZOLE 500 MG PO TABS: 500.0000 mg | ORAL_TABLET | Freq: Two times a day (BID) | ORAL | 0 refills | Status: DC

## 2016-02-13 NOTE — Discharge Instructions (Signed)
Take the medication as written. Give us a working phone number so that we can contact you if needed. Refrain from sexual contact until you know your results and your partner(s) are treated if necessary. Return to the ED if you get worse, have a fever >100.4, or for any concerns.   Go to www.goodrx.com to look up your medications. This will give you a list of where you can find your prescriptions at the most affordable prices.

## 2016-02-13 NOTE — ED Triage Notes (Signed)
Pt     Repots       Symptoms  Of        Vaginal   Discharge   Of     Foul  Odor        X   sev   Weeks             denys   Any  Pain he  Ambulated  To  Room   With a  Steady  Fluid  Gait        She  Reports  Has  Has   Tried  Various   Home  remeidies   Without  Any  releif

## 2016-02-13 NOTE — ED Provider Notes (Signed)
HPI  SUBJECTIVE:  Diana Walker is a 30 y.o. female who presents with  several weeks of odorous vaginal discharge.  No dysuria, urgency, frequency, cloudy or oderous urine, hematuria,  genital blisters, vaginal itching. She does report some vaginal spotting, states this is because she forgot to take several birth control pills No aggravating factors. Symptoms are better with warm baths.  No fevers, N/V, abd pain, back pain. No recent abx use, perfumed soaps / bodywashes. Pt sexually active with relatively new female  partner who is asxatic. They do not use condoms. STD's not a concern today. Similar sx before when had BV. Also has h/o trichomonas. No h/o gonorrhea chlamydia, yeast infection. No h/o syphilis, herpes, HIV. No h/o PID, ectopic pregnancy. No h/o DM. LMP last week in September. Patient is on OCPs. PMD: Dr. Jackquline DenmarkVieta Bland[facility]    History reviewed. No pertinent past medical history.  History reviewed. No pertinent surgical history.  History reviewed. No pertinent family history.  Social History  Substance Use Topics  . Smoking status: Never Smoker  . Smokeless tobacco: Never Used  . Alcohol use No    No current facility-administered medications for this encounter.   Current Outpatient Prescriptions:  .  metroNIDAZOLE (FLAGYL) 500 MG tablet, Take 1 tablet (500 mg total) by mouth 2 (two) times daily. X 7 days, Disp: 14 tablet, Rfl: 0 .  Norgestimate-Ethinyl Estradiol Triphasic (ORTHO TRI-CYCLEN LO) 0.18/0.215/0.25 MG-25 MCG tab, Take 1 tablet by mouth daily., Disp: , Rfl:   No Known Allergies   ROS  As noted in HPI.   Physical Exam  BP 138/89 (BP Location: Left Arm)   Pulse 67   Temp 99.2 F (37.3 C) (Oral)   Resp 16   LMP 01/31/2016   SpO2 100%   Constitutional: Well developed, well nourished, no acute distress Eyes:  EOMI, conjunctiva normal bilaterally HENT: Normocephalic, atraumatic,mucus membranes moist Respiratory: Normal inspiratory  effort Cardiovascular: Normal rate GI: nondistended soft, nontender. No suprapubic tenderness  back: No CVA tenderness GU: External genitalia normal.  Normal vaginal mucosa.  Normal os. Mild brownish discharge consistent with uterine bleeding from os. Clear mucoid mildly oderous vaginal discharge.  Uterus smooth,  NT. No  CMT. No  adnexal tenderness. No adnexal masses.  Chaperone present during exam skin: No rash, skin intact Musculoskeletal: no deformities Neurologic: Alert & oriented x 3, no focal neuro deficits Psychiatric: Speech and behavior appropriate   ED Course   Medications - No data to display  Orders Placed This Encounter  Procedures  . Pelvic exam    Standing Status:   Standing    Number of Occurrences:   1  . POCT urinalysis dip (device)    Standing Status:   Standing    Number of Occurrences:   1  . Pregnancy, urine POC    Standing Status:   Standing    Number of Occurrences:   1    Results for orders placed or performed during the hospital encounter of 02/13/16 (from the past 24 hour(s))  POCT urinalysis dip (device)     Status: Abnormal   Collection Time: 02/13/16  4:20 PM  Result Value Ref Range   Glucose, UA NEGATIVE NEGATIVE mg/dL   Bilirubin Urine NEGATIVE NEGATIVE   Ketones, ur NEGATIVE NEGATIVE mg/dL   Specific Gravity, Urine 1.015 1.005 - 1.030   Hgb urine dipstick MODERATE (A) NEGATIVE   pH 7.0 5.0 - 8.0   Protein, ur NEGATIVE NEGATIVE mg/dL  Urobilinogen, UA 0.2 0.0 - 1.0 mg/dL   Nitrite NEGATIVE NEGATIVE   Leukocytes, UA NEGATIVE NEGATIVE  Pregnancy, urine POC     Status: None   Collection Time: 02/13/16  4:31 PM  Result Value Ref Range   Preg Test, Ur NEGATIVE NEGATIVE   No results found.  ED Clinical Impression  BV (bacterial vaginosis)   ED Assessment/Plan  H&P most c/w  BV. Sent off GC/chlamydia, wet prep. Will not treat empirically now. Will send home with flagyl. Advised pt to refrain from sexual contact until she  knows lab  results, symptoms resolve, and partner(s) are treated if necessary. Pt provided working phone number. Follow-up with PMD as needed. Discussed labs, MDM, plan and followup with patient. Pt agrees with plan.    *This clinic note was created using Dragon dictation software. Therefore, there may be occasional mistakes despite careful proofreading.  ?    Domenick Gong, MD 02/13/16 [phone]

## 2016-02-14 LAB — CERVICOVAGINAL ANCILLARY ONLY
CHLAMYDIA, DNA PROBE: NEGATIVE
Neisseria Gonorrhea: NEGATIVE
Wet Prep (BD Affirm): POSITIVE — AB

## 2016-05-14 ENCOUNTER — Other Ambulatory Visit: Payer: Self-pay | Admitting: Obstetrics and Gynecology

## 2020-09-29 ENCOUNTER — Emergency Department (HOSPITAL_COMMUNITY): Payer: 59

## 2020-09-29 ENCOUNTER — Other Ambulatory Visit: Payer: Self-pay

## 2020-09-29 ENCOUNTER — Emergency Department (HOSPITAL_COMMUNITY)
Admission: EM | Admit: 2020-09-29 | Discharge: 2020-09-29 | Disposition: A | Discharge status: Home / Self Care | Payer: 59 | Attending: Emergency Medicine | Admitting: Emergency Medicine

## 2020-09-29 DIAGNOSIS — S61412A Laceration without foreign body of left hand, initial encounter: Secondary | ICD-10-CM | POA: Diagnosis not present

## 2020-09-29 DIAGNOSIS — S52502A Unspecified fracture of the lower end of left radius, initial encounter for closed fracture: Secondary | ICD-10-CM

## 2020-09-29 DIAGNOSIS — Z23 Encounter for immunization: Secondary | ICD-10-CM | POA: Diagnosis not present

## 2020-09-29 DIAGNOSIS — S52572A Other intraarticular fracture of lower end of left radius, initial encounter for closed fracture: Secondary | ICD-10-CM | POA: Insufficient documentation

## 2020-09-29 DIAGNOSIS — Y92410 Unspecified street and highway as the place of occurrence of the external cause: Secondary | ICD-10-CM | POA: Diagnosis not present

## 2020-09-29 DIAGNOSIS — S52612A Displaced fracture of left ulna styloid process, initial encounter for closed fracture: Secondary | ICD-10-CM | POA: Insufficient documentation

## 2020-09-29 DIAGNOSIS — S6992XA Unspecified injury of left wrist, hand and finger(s), initial encounter: Secondary | ICD-10-CM | POA: Diagnosis present

## 2020-09-29 MED ORDER — FENTANYL CITRATE (PF) 100 MCG/2ML IJ SOLN
100.0000 ug | Freq: Once | INTRAMUSCULAR | Status: AC
  Administered 2020-09-29: 100 ug via INTRAVENOUS
  Filled 2020-09-29: qty 2

## 2020-09-29 MED ORDER — OXYCODONE-ACETAMINOPHEN 5-325 MG PO TABS
1.0000 | ORAL_TABLET | Freq: Once | ORAL | Status: AC
  Administered 2020-09-29: 1 via ORAL
  Filled 2020-09-29: qty 1

## 2020-09-29 MED ORDER — MORPHINE SULFATE (PF) 4 MG/ML IV SOLN
4.0000 mg | Freq: Once | INTRAVENOUS | Status: AC
  Administered 2020-09-29: 4 mg via INTRAVENOUS
  Filled 2020-09-29: qty 1

## 2020-09-29 MED ORDER — IBUPROFEN 600 MG PO TABS: 600.0000 mg | ORAL_TABLET | Freq: Four times a day (QID) | ORAL | 0 refills | Status: AC | PRN

## 2020-09-29 MED ORDER — FENTANYL CITRATE (PF) 100 MCG/2ML IJ SOLN
75.0000 ug | Freq: Once | INTRAMUSCULAR | Status: AC
  Administered 2020-09-29: 75 ug via INTRAVENOUS
  Filled 2020-09-29: qty 2

## 2020-09-29 MED ORDER — HYDROCODONE-ACETAMINOPHEN 5-325 MG PO TABS: 1.0000 | ORAL_TABLET | Freq: Four times a day (QID) | ORAL | 0 refills | Status: DC | PRN

## 2020-09-29 MED ORDER — TETANUS-DIPHTH-ACELL PERTUSSIS 5-2.5-18.5 LF-MCG/0.5 IM SUSY
0.5000 mL | PREFILLED_SYRINGE | Freq: Once | INTRAMUSCULAR | Status: AC
  Administered 2020-09-29: 0.5 mL via INTRAMUSCULAR
  Filled 2020-09-29: qty 0.5

## 2020-09-29 MED ORDER — IBUPROFEN 600 MG PO TABS: 600.0000 mg | ORAL_TABLET | Freq: Four times a day (QID) | ORAL | 0 refills | Status: DC | PRN

## 2020-09-29 MED ORDER — LIDOCAINE HCL 2 % IJ SOLN
20.0000 mL | Freq: Once | INTRAMUSCULAR | Status: AC
  Administered 2020-09-29: 400 mg via INTRADERMAL
  Filled 2020-09-29: qty 20

## 2020-09-29 MED ORDER — MORPHINE SULFATE (PF) 4 MG/ML IV SOLN: 4.0000 mg | Freq: Once | INTRAVENOUS | Status: DC

## 2020-09-29 MED ORDER — HYDROCODONE-ACETAMINOPHEN 5-325 MG PO TABS: 1.0000 | ORAL_TABLET | Freq: Four times a day (QID) | ORAL | 0 refills | Status: AC | PRN

## 2020-09-29 NOTE — Progress Notes (Signed)
Orthopedic Tech Progress Note Patient Details:  Diana Walker 19-Aug-1985 [id number] MD called requesting for the finger trap. Did not apply just yet. Once MD is finish a VOLAR splint will be applied Patient ID: Diana Walker, female   DOB: 08/01/1985, 35 y.o.   MRN: [id number]   Donald Pore 09/29/2020, 6:49 PM
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
## 2020-09-29 NOTE — Progress Notes (Signed)
Orthopedic Tech Progress Note Patient Details:  Diana Walker 1985-06-23 [id number] MD/PA applied fingers into the finger trap, PA held break while I applied splint. Ortho Devices Type of Ortho Device: Finger trap,Cotton web roll Ortho Device/Splint Location: LUE Ortho Device/Splint Interventions: Application,Adjustment   Post Interventions Patient Tolerated: Well,Fair Instructions Provided: Care of device   Donald Pore 09/29/2020, 8:19 PM

## 2020-10-04 ENCOUNTER — Encounter (HOSPITAL_BASED_OUTPATIENT_CLINIC_OR_DEPARTMENT_OTHER): Payer: Self-pay | Admitting: Orthopedic Surgery

## 2020-10-05 ENCOUNTER — Other Ambulatory Visit: Payer: Self-pay | Admitting: Orthopedic Surgery

## 2020-10-06 NOTE — Progress Notes (Signed)

## 2020-10-10 ENCOUNTER — Other Ambulatory Visit: Payer: Self-pay

## 2020-10-10 ENCOUNTER — Encounter (HOSPITAL_BASED_OUTPATIENT_CLINIC_OR_DEPARTMENT_OTHER)
Admission: RE | Disposition: A | Discharge status: Home / Self Care | Payer: Self-pay | Source: Home / Self Care | Attending: Orthopedic Surgery

## 2020-10-10 ENCOUNTER — Ambulatory Visit (HOSPITAL_BASED_OUTPATIENT_CLINIC_OR_DEPARTMENT_OTHER): Payer: 59 | Admitting: Anesthesiology

## 2020-10-10 ENCOUNTER — Ambulatory Visit (HOSPITAL_BASED_OUTPATIENT_CLINIC_OR_DEPARTMENT_OTHER)
Admission: RE | Admit: 2020-10-10 | Discharge: 2020-10-10 | Disposition: A | Discharge status: Home / Self Care | Payer: 59 | Attending: Orthopedic Surgery | Admitting: Orthopedic Surgery

## 2020-10-10 ENCOUNTER — Encounter (HOSPITAL_BASED_OUTPATIENT_CLINIC_OR_DEPARTMENT_OTHER): Payer: Self-pay | Admitting: Orthopedic Surgery

## 2020-10-10 DIAGNOSIS — S52572A Other intraarticular fracture of lower end of left radius, initial encounter for closed fracture: Secondary | ICD-10-CM | POA: Diagnosis not present

## 2020-10-10 DIAGNOSIS — S61012A Laceration without foreign body of left thumb without damage to nail, initial encounter: Secondary | ICD-10-CM | POA: Insufficient documentation

## 2020-10-10 HISTORY — PX: OPEN REDUCTION INTERNAL FIXATION (ORIF) DISTAL RADIAL FRACTURE: SHX5989

## 2020-10-10 HISTORY — DX: Essential (primary) hypertension: I10

## 2020-10-10 HISTORY — PX: WOUND EXPLORATION: SHX6188

## 2020-10-10 LAB — POCT PREGNANCY, URINE: Preg Test, Ur: NEGATIVE

## 2020-10-10 SURGERY — OPEN REDUCTION INTERNAL FIXATION (ORIF) DISTAL RADIUS FRACTURE
Anesthesia: Regional | Site: Wrist | Laterality: Left

## 2020-10-10 MED ORDER — FENTANYL CITRATE (PF) 100 MCG/2ML IJ SOLN
INTRAMUSCULAR | Status: DC | PRN
  Administered 2020-10-10: 50 ug via INTRAVENOUS

## 2020-10-10 MED ORDER — LACTATED RINGERS IV SOLN: INTRAVENOUS | Status: DC

## 2020-10-10 MED ORDER — CEFAZOLIN SODIUM-DEXTROSE 2-4 GM/100ML-% IV SOLN
2.0000 g | INTRAVENOUS | Status: AC
  Administered 2020-10-10: 2 g via INTRAVENOUS

## 2020-10-10 MED ORDER — MIDAZOLAM HCL 2 MG/2ML IJ SOLN
2.0000 mg | Freq: Once | INTRAMUSCULAR | Status: AC
  Administered 2020-10-10: 2 mg via INTRAVENOUS

## 2020-10-10 MED ORDER — BUPIVACAINE HCL (PF) 0.5 % IJ SOLN
INTRAMUSCULAR | Status: DC | PRN
  Administered 2020-10-10: 15 mL via PERINEURAL

## 2020-10-10 MED ORDER — HYDROCODONE-ACETAMINOPHEN 5-325 MG PO TABS: ORAL_TABLET | ORAL | 0 refills | Status: AC

## 2020-10-10 MED ORDER — FENTANYL CITRATE (PF) 100 MCG/2ML IJ SOLN
25.0000 ug | INTRAMUSCULAR | Status: DC | PRN
  Administered 2020-10-10 (×2): 50 ug via INTRAVENOUS

## 2020-10-10 MED ORDER — LIDOCAINE HCL (CARDIAC) PF 100 MG/5ML IV SOSY
PREFILLED_SYRINGE | INTRAVENOUS | Status: DC | PRN
  Administered 2020-10-10: 40 mg via INTRAVENOUS

## 2020-10-10 MED ORDER — PHENYLEPHRINE 40 MCG/ML (10ML) SYRINGE FOR IV PUSH (FOR BLOOD PRESSURE SUPPORT)
PREFILLED_SYRINGE | INTRAVENOUS | Status: AC
  Filled 2020-10-10: qty 10

## 2020-10-10 MED ORDER — LIDOCAINE HCL (PF) 2 % IJ SOLN
INTRAMUSCULAR | Status: AC
  Filled 2020-10-10: qty 5

## 2020-10-10 MED ORDER — EPHEDRINE 5 MG/ML INJ
INTRAVENOUS | Status: AC
  Filled 2020-10-10: qty 10

## 2020-10-10 MED ORDER — FENTANYL CITRATE (PF) 100 MCG/2ML IJ SOLN
100.0000 ug | Freq: Once | INTRAMUSCULAR | Status: AC
  Administered 2020-10-10: 100 ug via INTRAVENOUS

## 2020-10-10 MED ORDER — FENTANYL CITRATE (PF) 100 MCG/2ML IJ SOLN
INTRAMUSCULAR | Status: AC
  Filled 2020-10-10: qty 2

## 2020-10-10 MED ORDER — ACETAMINOPHEN 10 MG/ML IV SOLN: 1000.0000 mg | Freq: Once | INTRAVENOUS | Status: DC | PRN

## 2020-10-10 MED ORDER — SUCCINYLCHOLINE CHLORIDE 200 MG/10ML IV SOSY
PREFILLED_SYRINGE | INTRAVENOUS | Status: AC
  Filled 2020-10-10: qty 10

## 2020-10-10 MED ORDER — DEXAMETHASONE SODIUM PHOSPHATE 10 MG/ML IJ SOLN
INTRAMUSCULAR | Status: AC
  Filled 2020-10-10: qty 1

## 2020-10-10 MED ORDER — BUPIVACAINE LIPOSOME 1.3 % IJ SUSP
INTRAMUSCULAR | Status: DC | PRN
  Administered 2020-10-10: 10 mL via PERINEURAL

## 2020-10-10 MED ORDER — PROMETHAZINE HCL 25 MG/ML IJ SOLN: 6.2500 mg | INTRAMUSCULAR | Status: DC | PRN

## 2020-10-10 MED ORDER — DEXAMETHASONE SODIUM PHOSPHATE 10 MG/ML IJ SOLN
INTRAMUSCULAR | Status: DC | PRN
  Administered 2020-10-10: 10 mg via INTRAVENOUS

## 2020-10-10 MED ORDER — MIDAZOLAM HCL 2 MG/2ML IJ SOLN
INTRAMUSCULAR | Status: AC
  Filled 2020-10-10: qty 2

## 2020-10-10 MED ORDER — CEFAZOLIN SODIUM-DEXTROSE 2-4 GM/100ML-% IV SOLN
INTRAVENOUS | Status: AC
  Filled 2020-10-10: qty 100

## 2020-10-10 MED ORDER — ONDANSETRON HCL 4 MG/2ML IJ SOLN
INTRAMUSCULAR | Status: AC
  Filled 2020-10-10: qty 2

## 2020-10-10 MED ORDER — MIDAZOLAM HCL 5 MG/5ML IJ SOLN
INTRAMUSCULAR | Status: DC | PRN
  Administered 2020-10-10: 2 mg via INTRAVENOUS

## 2020-10-10 MED ORDER — PROPOFOL 10 MG/ML IV BOLUS
INTRAVENOUS | Status: DC | PRN
  Administered 2020-10-10: 250 mg via INTRAVENOUS

## 2020-10-10 SURGICAL SUPPLY — 107 items
APL PRP STRL LF DISP 70% ISPRP (MISCELLANEOUS) ×3
BAG DECANTER FOR FLEXI CONT (MISCELLANEOUS) IMPLANT
BALL CTTN LRG ABS STRL LF (GAUZE/BANDAGES/DRESSINGS)
BIT DRILL 2.0 LNG QUCK RELEASE (BIT) ×3 IMPLANT
BIT DRILL QC 2.8X5 (BIT) ×5 IMPLANT
BLADE MINI RND TIP GREEN BEAV (BLADE) IMPLANT
BLADE SURG 15 STRL LF DISP TIS (BLADE) ×6 IMPLANT
BLADE SURG 15 STRL SS (BLADE) ×10
BNDG CMPR 9X4 STRL LF SNTH (GAUZE/BANDAGES/DRESSINGS) ×3
BNDG CONFORM 2 STRL LF (GAUZE/BANDAGES/DRESSINGS) IMPLANT
BNDG ELASTIC 2X5.8 VLCR STR LF (GAUZE/BANDAGES/DRESSINGS) IMPLANT
BNDG ELASTIC 3X5.8 VLCR STR LF (GAUZE/BANDAGES/DRESSINGS) ×5 IMPLANT
BNDG ESMARK 4X9 LF (GAUZE/BANDAGES/DRESSINGS) ×5 IMPLANT
BNDG GAUZE ELAST 4 BULKY (GAUZE/BANDAGES/DRESSINGS) ×5 IMPLANT
BNDG PLASTER X FAST 3X3 WHT LF (CAST SUPPLIES) ×50 IMPLANT
BNDG PLSTR 9X3 FST ST WHT (CAST SUPPLIES) ×30
CATH ROBINSON RED A/P 10FR (CATHETERS) IMPLANT
CHLORAPREP W/TINT 26 (MISCELLANEOUS) ×5 IMPLANT
CORD BIPOLAR FORCEPS 12FT (ELECTRODE) ×5 IMPLANT
COTTONBALL LRG STERILE PKG (GAUZE/BANDAGES/DRESSINGS) IMPLANT
COVER BACK TABLE 60X90IN (DRAPES) ×5 IMPLANT
COVER MAYO STAND STRL (DRAPES) ×5 IMPLANT
COVER WAND RF STERILE (DRAPES) IMPLANT
CUFF TOURN SGL QUICK 18X4 (TOURNIQUET CUFF) ×5 IMPLANT
CUFF TOURN SGL QUICK 24 (TOURNIQUET CUFF)
CUFF TRNQT CYL 24X4X16.5-23 (TOURNIQUET CUFF) IMPLANT
DECANTER SPIKE VIAL GLASS SM (MISCELLANEOUS) ×5 IMPLANT
DRAPE EXTREMITY T 121X128X90 (DISPOSABLE) ×5 IMPLANT
DRAPE OEC MINIVIEW 54X84 (DRAPES) ×5 IMPLANT
DRAPE SURG 17X23 STRL (DRAPES) ×5 IMPLANT
DRILL 2.0 LNG QUICK RELEASE (BIT) ×5
DRSG PAD ABDOMINAL 8X10 ST (GAUZE/BANDAGES/DRESSINGS) IMPLANT
GAUZE 4X4 16PLY RFD (DISPOSABLE) IMPLANT
GAUZE SPONGE 4X4 12PLY STRL (GAUZE/BANDAGES/DRESSINGS) ×5 IMPLANT
GAUZE XEROFORM 1X8 LF (GAUZE/BANDAGES/DRESSINGS) ×5 IMPLANT
GLOVE SRG 8 PF TXTR STRL LF DI (GLOVE) ×3 IMPLANT
GLOVE SURG ENC MOIS LTX SZ7.5 (GLOVE) ×5 IMPLANT
GLOVE SURG ORTHO LTX SZ8 (GLOVE) ×5 IMPLANT
GLOVE SURG UNDER POLY LF SZ8 (GLOVE) ×5
GLOVE SURG UNDER POLY LF SZ8.5 (GLOVE) ×5 IMPLANT
GOWN STRL REUS W/ TWL LRG LVL3 (GOWN DISPOSABLE) ×3 IMPLANT
GOWN STRL REUS W/TWL LRG LVL3 (GOWN DISPOSABLE) ×5
GOWN STRL REUS W/TWL XL LVL3 (GOWN DISPOSABLE) ×5 IMPLANT
GUIDEWIRE ORTHO 0.054X6 (WIRE) ×15 IMPLANT
K-WIRE .035X4 (WIRE) IMPLANT
LOOP VESSEL MAXI BLUE (MISCELLANEOUS) IMPLANT
NDL SAFETY ECLIPSE 18X1.5 (NEEDLE) IMPLANT
NEEDLE HYPO 18GX1.5 SHARP (NEEDLE)
NEEDLE HYPO 25X1 1.5 SAFETY (NEEDLE) IMPLANT
NEEDLE KEITH (NEEDLE) IMPLANT
NS IRRIG 1000ML POUR BTL (IV SOLUTION) ×5 IMPLANT
PACK BASIN DAY SURGERY FS (CUSTOM PROCEDURE TRAY) ×5 IMPLANT
PAD CAST 3X4 CTTN HI CHSV (CAST SUPPLIES) ×3 IMPLANT
PAD CAST 4YDX4 CTTN HI CHSV (CAST SUPPLIES) ×3 IMPLANT
PADDING CAST ABS 3INX4YD NS (CAST SUPPLIES)
PADDING CAST ABS 4INX4YD NS (CAST SUPPLIES) ×2
PADDING CAST ABS COTTON 3X4 (CAST SUPPLIES) IMPLANT
PADDING CAST ABS COTTON 4X4 ST (CAST SUPPLIES) ×3 IMPLANT
PADDING CAST COTTON 3X4 STRL (CAST SUPPLIES) ×5
PADDING CAST COTTON 4X4 STRL (CAST SUPPLIES) ×5
PLATE LEFT DIST RADIUS NARROW (Plate) ×5 IMPLANT
SCREW CORT FT 22X2.3XLCK HEX (Screw) ×6 IMPLANT
SCREW CORTICAL LOCKING 2.3X18M (Screw) ×5 IMPLANT
SCREW CORTICAL LOCKING 2.3X20M (Screw) ×15 IMPLANT
SCREW CORTICAL LOCKING 2.3X22M (Screw) ×10 IMPLANT
SCREW FX18X2.3XSMTH LCK NS CRT (Screw) ×3 IMPLANT
SCREW FX20X2.3XSMTH LCK NS CRT (Screw) ×9 IMPLANT
SCREW HEX 3.5X15 NLCKG STRL (Screw) ×3 IMPLANT
SCREW HEX 3.5X15MM (Screw) ×5 IMPLANT
SCREW NLCKG 13 3.5X13 HEXA (Screw) ×3 IMPLANT
SCREW NON-LOCK 3.5X13 (Screw) ×5 IMPLANT
SCREW NONLOCK HEX 3.5X12 (Screw) ×5 IMPLANT
SLEEVE SCD COMPRESS KNEE MED (STOCKING) ×5 IMPLANT
SPEAR EYE SURG WECK-CEL (MISCELLANEOUS) ×5 IMPLANT
SPLINT PLASTER CAST XFAST 3X15 (CAST SUPPLIES) ×30 IMPLANT
SPLINT PLASTER XTRA FASTSET 3X (CAST SUPPLIES) ×20
STOCKINETTE 4X48 STRL (DRAPES) ×5 IMPLANT
SUT CHROMIC 5 0 P 3 (SUTURE) IMPLANT
SUT ETHIBOND 3-0 V-5 (SUTURE) IMPLANT
SUT ETHILON 3 0 PS 1 (SUTURE) IMPLANT
SUT ETHILON 4 0 PS 2 18 (SUTURE) ×5 IMPLANT
SUT FIBERWIRE 3-0 18 TAPR NDL (SUTURE)
SUT FIBERWIRE 4-0 18 DIAM BLUE (SUTURE)
SUT FIBERWIRE 4-0 18 TAPR NDL (SUTURE)
SUT MERSILENE 2.0 SH NDLE (SUTURE) IMPLANT
SUT MERSILENE 4 0 P 3 (SUTURE) IMPLANT
SUT MNCRL AB 4-0 PS2 18 (SUTURE) IMPLANT
SUT MON AB 5-0 PS2 18 (SUTURE) IMPLANT
SUT NYLON 9 0 VRM6 (SUTURE) IMPLANT
SUT PROLENE 2 0 SH DA (SUTURE) IMPLANT
SUT PROLENE 6 0 P 1 18 (SUTURE) IMPLANT
SUT SILK 2 0 PERMA HAND 18 BK (SUTURE) IMPLANT
SUT SILK 4 0 PS 2 (SUTURE) IMPLANT
SUT SUPRAMID 4-0 (SUTURE) IMPLANT
SUT VIC AB 3-0 PS1 18 (SUTURE)
SUT VIC AB 3-0 PS1 18XBRD (SUTURE) IMPLANT
SUT VIC AB 4-0 P-3 18XBRD (SUTURE) IMPLANT
SUT VIC AB 4-0 P3 18 (SUTURE)
SUT VICRYL 4-0 PS2 18IN ABS (SUTURE) ×5 IMPLANT
SUTURE FIBERWR 3-0 18 TAPR NDL (SUTURE) IMPLANT
SUTURE FIBERWR 4-0 18 DIA BLUE (SUTURE) IMPLANT
SUTURE FIBERWR 4-0 18 TAPR NDL (SUTURE) IMPLANT
SYR BULB EAR ULCER 3OZ GRN STR (SYRINGE) ×5 IMPLANT
SYR CONTROL 10ML LL (SYRINGE) IMPLANT
TOWEL GREEN STERILE FF (TOWEL DISPOSABLE) ×10 IMPLANT
TUBE FEEDING ENTERAL 5FR 16IN (TUBING) IMPLANT
UNDERPAD 30X36 HEAVY ABSORB (UNDERPADS AND DIAPERS) ×5 IMPLANT

## 2020-10-10 NOTE — Transfer of Care (Signed)
Immediate Anesthesia Transfer of Care Note  Patient: Diana Walker  Procedure(s) Performed: OPEN REDUCTION INTERNAL FIXATION (ORIF) LEFT DISTAL RADIAL FRACTURE (Left Wrist) LACERATION EXPLORATION LEFT THUMB (Left Thumb)  Patient Location: PACU  Anesthesia Type:GA combined with regional for post-op pain  Level of Consciousness: sedated  Airway & Oxygen Therapy: Patient Spontanous Breathing and Patient connected to nasal cannula oxygen  Post-op Assessment: Report given to RN and Post -op Vital signs reviewed and stable  Post vital signs: Reviewed and stable  Last Vitals:  Vitals Value Taken Time  BP    Temp    Pulse 93 10/10/20 1727  Resp 17 10/10/20 1727  SpO2 98 % 10/10/20 1727  Vitals shown include unvalidated device data.  Last Pain:  Vitals:   10/10/20 1348  TempSrc: Oral  PainSc: 0-No pain         Complications: No complications documented.

## 2020-10-10 NOTE — Op Note (Signed)
I assisted Surgeon(s) and Role:    * Betha Loa, MD - Primary    Cindee Salt, MD - Assisting on the Procedure(s): OPEN REDUCTION INTERNAL FIXATION (ORIF) LEFT DISTAL RADIAL FRACTURE LACERATION EXPLORATION LEFT THUMB on 10/10/2020.  I provided assistance on this case as follows: Set up, approach, retraction, identification of the fracture, tenotomy of the brachial radialis, debridement of the fracture, reduction of the fracture, stabilization of the fracture, application of plates and screws to the distal radius fracture, closure of the wound, application of the dressing and splint.  Electronically signed by: Cindee Salt, MD Date: 10/10/2020 Time: 5:16 PM

## 2020-10-10 NOTE — Progress Notes (Signed)
Assisted Dr. Greg Stoltzfus with left, ultrasound guided, supraclavicular block. Side rails up, monitors on throughout procedure. See vital signs in flow sheet. Tolerated Procedure well. 
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
## 2020-10-10 NOTE — Anesthesia Procedure Notes (Signed)
Procedure Name: LMA Insertion Date/Time: 10/10/2020 3:55 PM Performed by: Ronnette Hila, CRNA Pre-anesthesia Checklist: Patient identified, Emergency Drugs available, Suction available and Patient being monitored Patient Re-evaluated:Patient Re-evaluated prior to induction Oxygen Delivery Method: Circle system utilized Preoxygenation: Pre-oxygenation with 100% oxygen Induction Type: IV induction Ventilation: Mask ventilation without difficulty LMA: LMA inserted LMA Size: 4.0 Number of attempts: 1 Airway Equipment and Method: Bite block Placement Confirmation: positive ETCO2 Tube secured with: Tape Dental Injury: Teeth and Oropharynx as per pre-operative assessment
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
## 2020-10-10 NOTE — Discharge Instructions (Addendum)
[facility] Instructions Hand Surgery  Wound Care: Keep your hand elevated above the level of your heart.  Do not allow it to dangle by your side.  Keep the dressing dry and do not remove it unless your doctor advises you to do so.  He will usually change it at the time of your post-op visit.  Moving your fingers is advised to stimulate circulation but will depend on the site of your surgery.  If you have a splint applied, your doctor will advise you regarding movement.  Activity: Do not drive or operate machinery today.  Rest today and then you may return to your normal activity and work as indicated by your physician.  Diet:  Drink liquids today or eat a light diet.  You may resume a regular diet tomorrow.    General expectations: Pain for two to three days. Fingers may become slightly swollen.  Call your doctor if any of the following occur: Severe pain not relieved by pain medication. Elevated temperature. Dressing soaked with blood. Inability to move fingers. White or bluish color to fingers. Regional Anesthesia Blocks  1. Numbness or the inability to move the "blocked" extremity may last from 3-48 hours after placement. The length of time depends on the medication injected and your individual response to the medication. If the numbness is not going away after 48 hours, call your surgeon.  2. The extremity that is blocked will need to be protected until the numbness is gone and the  Strength has returned. Because you cannot feel it, you will need to take extra care to avoid injury. Because it may be weak, you may have difficulty moving it or using it. You may not know what position it is in without looking at it while the block is in effect.  3. For blocks in the legs and feet, returning to weight bearing and walking needs to be done carefully. You will need to wait until the numbness is entirely gone and the strength has returned. You should be able to move your leg and foot  normally before you try and bear weight or walk. You will need someone to be with you when you first try to ensure you do not fall and possibly risk injury.  4. Bruising and tenderness at the needle site are common side effects and will resolve in a few days.  5. Persistent numbness or new problems with movement should be communicated to the surgeon or the [facility] [phone] [facility] [phone]).  Post Anesthesia Home Care Instructions  Activity: Get plenty of rest for the remainder of the day. A responsible individual must stay with you for 24 hours following the procedure.  For the next 24 hours, DO NOT: -Drive a car -Advertising copywriter -Drink alcoholic beverages -Take any medication unless instructed by your physician -Make any legal decisions or sign important papers.  Meals: Start with liquid foods such as gelatin or soup. Progress to regular foods as tolerated. Avoid greasy, spicy, heavy foods. If nausea and/or vomiting occur, drink only clear liquids until the nausea and/or vomiting subsides. Call your physician if vomiting continues.  Special Instructions/Symptoms: Your throat may feel dry or sore from the anesthesia or the breathing tube placed in your throat during surgery. If this causes discomfort, gargle with warm salt water. The discomfort should disappear within 24 hours.  If you had a scopolamine patch placed behind your ear for the management of post- operative nausea and/or vomiting:  1.  The medication in the patch is effective for 72 hours, after which it should be removed.  Wrap patch in a tissue and discard in the trash. Wash hands thoroughly with soap and water. 2. You may remove the patch earlier than 72 hours if you experience unpleasant side effects which may include dry mouth, dizziness or visual disturbances. 3. Avoid touching the patch. Wash your hands with soap and water after contact with the patch.    Information for  Discharge Teaching: EXPAREL (bupivacaine liposome injectable suspension)   Your surgeon or anesthesiologist gave you EXPAREL(bupivacaine) to help control your pain after surgery.   EXPAREL is a local anesthetic that provides pain relief by numbing the tissue around the surgical site.  EXPAREL is designed to release pain medication over time and can control pain for up to 72 hours.  Depending on how you respond to EXPAREL, you may require less pain medication during your recovery.  Possible side effects:  Temporary loss of sensation or ability to move in the area where bupivacaine was injected.  Nausea, vomiting, constipation  Rarely, numbness and tingling in your mouth or lips, lightheadedness, or anxiety may occur.  Call your doctor right away if you think you may be experiencing any of these sensations, or if you have other questions regarding possible side effects.  Follow all other discharge instructions given to you by your surgeon or nurse. Eat a healthy diet and drink plenty of water or other fluids.  If you return to the hospital for any reason within 96 hours following the administration of EXPAREL, it is important for health care providers to know that you have received this anesthetic. A teal colored band has been placed on your arm with the date, time and amount of EXPAREL you have received in order to alert and inform your health care providers. Please leave this armband in place for the full 96 hours following administration, and then you may remove the band.

## 2020-10-10 NOTE — Anesthesia Procedure Notes (Signed)
Anesthesia Regional Block: Supraclavicular block   Pre-Anesthetic Checklist: ,, timeout performed, Correct Patient, Correct Site, Correct Laterality, Correct Procedure, Correct Position, site marked, Risks and benefits discussed,  Surgical consent,  Pre-op evaluation,  At surgeon's request and post-op pain management  Laterality: Left  Prep: Dura Prep       Needles:  Injection technique: Single-shot  Needle Type: Echogenic Stimulator Needle     Needle Length: 5cm  Needle Gauge: 20     Additional Needles:   Procedures:,,,, ultrasound used (permanent image in chart),,,,  Narrative:  Start time: 10/10/2020 2:54 PM End time: 10/10/2020 2:58 PM Injection made incrementally with aspirations every 5 mL.  Performed by: Personally  Anesthesiologist: Atilano Median, DO  Additional Notes: Patient identified. Risks/Benefits/Options discussed with patient including but not limited to bleeding, infection, nerve damage, failed block, incomplete pain control. Patient expressed understanding and wished to proceed. All questions were answered. Sterile technique was used throughout the entire procedure. Please see nursing notes for vital signs. Aspirated in 5cc intervals with injection for negative confirmation. Patient was given instructions on fall risk and not to get out of bed. All questions and concerns addressed with instructions to call with any issues or inadequate analgesia.

## 2020-10-11 NOTE — Anesthesia Postprocedure Evaluation (Signed)
Anesthesia Post Note  Patient: Diana Walker  Procedure(s) Performed: OPEN REDUCTION INTERNAL FIXATION (ORIF) LEFT DISTAL RADIAL FRACTURE (Left Wrist) LACERATION EXPLORATION LEFT THUMB (Left Thumb)     Patient location during evaluation: PACU Anesthesia Type: Regional and General Level of consciousness: awake and alert Pain management: pain level controlled Vital Signs Assessment: post-procedure vital signs reviewed and stable Respiratory status: spontaneous breathing, nonlabored ventilation, respiratory function stable and patient connected to nasal cannula oxygen Cardiovascular status: blood pressure returned to baseline and stable Postop Assessment: no apparent nausea or vomiting Anesthetic complications: no   No complications documented.  Last Vitals:  Vitals:   10/10/20 1800 10/10/20 1815  BP: (!) 167/107 (!) 147/105  Pulse: 78 92  Resp: 12 16  Temp:  (!) 36.3 C  SpO2: 96% 100%    Last Pain:  Vitals:   10/10/20 1815  TempSrc:   PainSc: 0-No pain                 Earl Lites P Himani Corona

## 2020-10-12 ENCOUNTER — Encounter (HOSPITAL_BASED_OUTPATIENT_CLINIC_OR_DEPARTMENT_OTHER): Payer: Self-pay | Admitting: Orthopedic Surgery

## 2021-09-04 ENCOUNTER — Encounter (HOSPITAL_COMMUNITY): Payer: Self-pay | Admitting: Emergency Medicine

## 2021-09-04 ENCOUNTER — Emergency Department (HOSPITAL_COMMUNITY)
Admission: EM | Admit: 2021-09-04 | Discharge: 2021-09-04 | Disposition: A | Discharge status: Home / Self Care | Payer: 59 | Attending: Emergency Medicine | Admitting: Emergency Medicine

## 2021-09-04 ENCOUNTER — Other Ambulatory Visit: Payer: Self-pay

## 2021-09-04 DIAGNOSIS — T63301A Toxic effect of unspecified spider venom, accidental (unintentional), initial encounter: Secondary | ICD-10-CM | POA: Insufficient documentation

## 2021-09-04 DIAGNOSIS — R079 Chest pain, unspecified: Secondary | ICD-10-CM | POA: Diagnosis not present

## 2021-09-04 DIAGNOSIS — X58XXXA Exposure to other specified factors, initial encounter: Secondary | ICD-10-CM

## 2021-09-04 DIAGNOSIS — F419 Anxiety disorder, unspecified: Secondary | ICD-10-CM | POA: Insufficient documentation

## 2021-09-04 DIAGNOSIS — R202 Paresthesia of skin: Secondary | ICD-10-CM | POA: Insufficient documentation

## 2021-09-04 DIAGNOSIS — I10 Essential (primary) hypertension: Secondary | ICD-10-CM | POA: Insufficient documentation

## 2021-09-04 DIAGNOSIS — R0602 Shortness of breath: Secondary | ICD-10-CM | POA: Insufficient documentation

## 2021-09-04 NOTE — ED Triage Notes (Signed)
Patient BIB [facility] with complaints of getting spider "juice" on her while killing a spider. Pt worried that this caused some kind of reaction from it. Initially having some SHOB and chest tightness. Felt anxious upon this happening. Pt NAD at this time.  ?

## 2021-09-04 NOTE — Discharge Instructions (Signed)
You were seen in the emergency department today for coming in contact with a spider.  Please return for any worsening concerns. ?

## 2021-09-04 NOTE — ED Provider Notes (Signed)
?[facility] EMERGENCY DEPARTMENT ?Provider Note ? ? ?CSN: [id number] ?Arrival date & time: 09/04/21  1541 ? ?  ? ?History ? ?Chief Complaint  ?Patient presents with  ? Insect Bite  ? ? ?Diana Walker is a 36 y.o. female. With past medical history of hypertension who presents to the emergency department with possible insect bite. ? ?States that just prior to arrival patient was in the kitchen cleaning when she noticed a spider fall from the ceiling.  She states that it was black with a red spot.  She states that she stomped on it and killed it.  She states that after that she wiped it up and as she was wiping she felt a tingling sensation in her arm and then began having chest pain, shortness of breath and severe anxiety.  She states that the symptoms have subsided but she was concerned about a reaction to the spider. ? ?HPI ? ?  ? ?Home Medications ?Prior to Admission medications   ?Medication Sig Start Date End Date Taking? Authorizing Provider  ?HYDROcodone-acetaminophen (NORCO) 5-325 MG tablet 1-2 tabs po q6 hours prn pain 10/10/20   Betha Loa, MD  ?ibuprofen (ADVIL) 600 MG tablet Take 1 tablet (600 mg total) by mouth every 6 (six) hours as needed. 09/29/20   Couture, Cortni S, PA-C  ?Norgestimate-Eth Estradiol (MILI PO) Take by mouth.    [provider]  ?   ? ?Allergies    ?Patient has no known allergies.   ? ?Review of Systems   ?Review of Systems  ?Psychiatric/Behavioral:  The patient is nervous/anxious.   ?All other systems reviewed and are negative. ? ?Physical Exam ?Updated Vital Signs ?BP 135/87 (BP Location: Right Arm)   Pulse 100   Temp 98 ?F (36.7 ?C) (Oral)   Resp 16   SpO2 100%  ?Physical Exam ?Vitals and nursing note reviewed.  ?Constitutional:   ?   General: She is not in acute distress. ?   Appearance: Normal appearance. She is normal weight. She is not ill-appearing or toxic-appearing.  ?HENT:  ?   Head: Normocephalic and atraumatic.  ?   Mouth/Throat:  ?   Mouth:  Mucous membranes are moist.  ?   Pharynx: Oropharynx is clear.  ?Eyes:  ?   General: No scleral icterus. ?   Extraocular Movements: Extraocular movements intact.  ?Cardiovascular:  ?   Rate and Rhythm: Normal rate and regular rhythm.  ?   Pulses: Normal pulses.  ?Pulmonary:  ?   Effort: Pulmonary effort is normal.  ?   Breath sounds: Normal breath sounds.  ?Abdominal:  ?   General: Bowel sounds are normal.  ?   Palpations: Abdomen is soft.  ?Musculoskeletal:     ?   General: Normal range of motion.  ?   Cervical back: Neck supple.  ?Skin: ?   General: Skin is warm and dry.  ?   Capillary Refill: Capillary refill takes less than 2 seconds.  ?   Findings: No erythema or rash.  ?   Comments: No evidence of bite.  No erythema, rash, target or halo lesion.  ?Neurological:  ?   General: No focal deficit present.  ?   Mental Status: She is alert and oriented to person, place, and time. Mental status is at baseline.  ?   Sensory: No sensory deficit.  ?   Motor: No weakness.  ?Psychiatric:     ?   Mood and Affect: Mood normal.     ?  Behavior: Behavior normal.     ?   Thought Content: Thought content normal.     ?   Judgment: Judgment normal.  ? ? ?ED Results / Procedures / Treatments   ?Labs ?(all labs ordered are listed, but only abnormal results are displayed) ?Labs Reviewed - No data to display ? ?EKG ?None ? ?Radiology ?No results found. ? ?Procedures ?Procedures  ? ? ?Medications Ordered in ED ?Medications - No data to display ? ?ED Course/ Medical Decision Making/ A&P ?  ?                        ?Medical Decision Making ?36 year old female who is presented to the emergency department for coming in contact with a spider. ?Not actually bit by the spider.  No evidence of bite or allergic reaction. ?Physical exam unremarkable ?Likely anxiety related to the event ? ?Does not require labs, imaging, EKG, cardiac monitoring or medications.  No social determinants of health identified that need to be addressed at this time.   I have reviewed her medical record. ?Feel that she is safe for discharge.  Given return precautions should she begin to have worsening symptoms. She verbalizes understanding. States she wanted to ensure how she should clean her floor from where she killed the spider. Discharge.  ?Final Clinical Impression(s) / ED Diagnoses ?Final diagnoses:  ?Contact with spider as cause of accidental injury  ? ? ?Rx / DC Orders ?ED Discharge Orders   ? ? None  ? ?  ? ? ?  ?Cristopher Peru, PA-C ?09/04/21 1857 ? ?  ?Melene Plan, DO ?09/04/21 1900 ? ?

## 2021-09-04 NOTE — ED Triage Notes (Signed)
Pt states spider dropped from ceiling, she killed it on the floor and then when she picked it up with a paper towel she started feeling shob.  ?

## 2021-09-07 ENCOUNTER — Encounter (HOSPITAL_COMMUNITY): Payer: Self-pay

## 2021-09-07 ENCOUNTER — Ambulatory Visit (HOSPITAL_COMMUNITY)
Admission: EM | Admit: 2021-09-07 | Discharge: 2021-09-07 | Disposition: A | Discharge status: Home / Self Care | Payer: 59 | Attending: Physician Assistant | Admitting: Physician Assistant

## 2021-09-07 DIAGNOSIS — R002 Palpitations: Secondary | ICD-10-CM | POA: Diagnosis present

## 2021-09-07 LAB — CBC WITH DIFFERENTIAL/PLATELET
Abs Immature Granulocytes: 0.04 10*3/uL (ref 0.00–0.07)
Basophils Absolute: 0 10*3/uL (ref 0.0–0.1)
Basophils Relative: 0 %
Eosinophils Absolute: 0.1 10*3/uL (ref 0.0–0.5)
Eosinophils Relative: 1 %
HCT: 37.9 % (ref 36.0–46.0)
Hemoglobin: 12.4 g/dL (ref 12.0–15.0)
Immature Granulocytes: 0 %
Lymphocytes Relative: 37 %
Lymphs Abs: 4 10*3/uL (ref 0.7–4.0)
MCH: 29.2 pg (ref 26.0–34.0)
MCHC: 32.7 g/dL (ref 30.0–36.0)
MCV: 89.4 fL (ref 80.0–100.0)
Monocytes Absolute: 0.8 10*3/uL (ref 0.1–1.0)
Monocytes Relative: 7 %
Neutro Abs: 6 10*3/uL (ref 1.7–7.7)
Neutrophils Relative %: 55 %
Platelets: 291 10*3/uL (ref 150–400)
RBC: 4.24 MIL/uL (ref 3.87–5.11)
RDW: 13.2 % (ref 11.5–15.5)
WBC: 11 10*3/uL — ABNORMAL HIGH (ref 4.0–10.5)
nRBC: 0 % (ref 0.0–0.2)

## 2021-09-07 LAB — POCT URINALYSIS DIPSTICK, ED / UC
Bilirubin Urine: NEGATIVE
Glucose, UA: NEGATIVE mg/dL
Ketones, ur: NEGATIVE mg/dL
Leukocytes,Ua: NEGATIVE
Nitrite: NEGATIVE
Protein, ur: NEGATIVE mg/dL
Specific Gravity, Urine: 1.015 (ref 1.005–1.030)
Urobilinogen, UA: 0.2 mg/dL (ref 0.0–1.0)
pH: 6.5 (ref 5.0–8.0)

## 2021-09-07 LAB — COMPREHENSIVE METABOLIC PANEL
ALT: 15 U/L (ref 0–44)
AST: 24 U/L (ref 15–41)
Albumin: 4.3 g/dL (ref 3.5–5.0)
Alkaline Phosphatase: 81 U/L (ref 38–126)
Anion gap: 8 (ref 5–15)
BUN: 11 mg/dL (ref 6–20)
CO2: 27 mmol/L (ref 22–32)
Calcium: 9.3 mg/dL (ref 8.9–10.3)
Chloride: 104 mmol/L (ref 98–111)
Creatinine, Ser: 0.97 mg/dL (ref 0.44–1.00)
GFR, Estimated: >60 mL/min (ref >60)
Glucose, Bld: 83 mg/dL (ref 70–99)
Potassium: 3.8 mmol/L (ref 3.5–5.1)
Sodium: 139 mmol/L (ref 135–145)
Total Bilirubin: 0.3 mg/dL (ref 0.3–1.2)
Total Protein: 8.2 g/dL — ABNORMAL HIGH (ref 6.5–8.1)

## 2021-09-07 LAB — TSH: TSH: 0.868 u[IU]/mL (ref 0.350–4.500)

## 2021-09-07 LAB — POC URINE PREG, ED: Preg Test, Ur: NEGATIVE
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
## 2021-09-07 NOTE — Discharge Instructions (Signed)
Your EKG looked good today.  Your urine showed no evidence of infection.  I will contact you with your lab results if anything is abnormal.  Please make sure you rest and drink plenty of fluid.  Given you are having these episodes of heart racing it may be worthwhile to follow-up with a cardiologist if they continue.  Please call to schedule an appointment.  If you have any ongoing heart racing, shortness of breath, chest pain, lightheadedness, passing out you need to go to the emergency room immediately. ?

## 2021-09-07 NOTE — ED Triage Notes (Signed)
Pt reports left arm pain and heart fluttering today. She has a h/o hypertension and is currently taking lisinopril. ?

## 2021-10-26 ENCOUNTER — Encounter (HOSPITAL_COMMUNITY): Payer: Self-pay | Admitting: Emergency Medicine

## 2021-10-26 ENCOUNTER — Ambulatory Visit (HOSPITAL_COMMUNITY)
Admission: EM | Admit: 2021-10-26 | Discharge: 2021-10-26 | Disposition: A | Discharge status: Home / Self Care | Payer: 59 | Attending: Physician Assistant | Admitting: Physician Assistant

## 2021-10-26 DIAGNOSIS — B3731 Acute candidiasis of vulva and vagina: Secondary | ICD-10-CM | POA: Diagnosis not present

## 2021-10-26 DIAGNOSIS — N760 Acute vaginitis: Secondary | ICD-10-CM | POA: Diagnosis not present

## 2021-10-26 DIAGNOSIS — R350 Frequency of micturition: Secondary | ICD-10-CM | POA: Insufficient documentation

## 2021-10-26 LAB — POCT URINALYSIS DIPSTICK, ED / UC
Bilirubin Urine: NEGATIVE
Glucose, UA: NEGATIVE mg/dL
Ketones, ur: NEGATIVE mg/dL
Leukocytes,Ua: NEGATIVE
Nitrite: NEGATIVE
Protein, ur: NEGATIVE mg/dL
Specific Gravity, Urine: 1.02 (ref 1.005–1.030)
Urobilinogen, UA: 0.2 mg/dL (ref 0.0–1.0)
pH: 7 (ref 5.0–8.0)

## 2021-10-26 MED ORDER — FLUCONAZOLE 150 MG PO TABS: 150.0000 mg | ORAL_TABLET | Freq: Every day | ORAL | 0 refills | Status: AC

## 2021-10-28 LAB — URINE CULTURE: Culture: <10000 — AB

## 2021-10-29 LAB — CERVICOVAGINAL ANCILLARY ONLY
Bacterial Vaginitis (gardnerella): POSITIVE — AB
Candida Glabrata: NEGATIVE
Candida Vaginitis: NEGATIVE
Chlamydia: NEGATIVE
Comment: NEGATIVE
Comment: NEGATIVE
Comment: NEGATIVE
Comment: NEGATIVE
Comment: NEGATIVE
Comment: NORMAL
Neisseria Gonorrhea: NEGATIVE
Trichomonas: NEGATIVE

## 2021-10-30 ENCOUNTER — Telehealth (HOSPITAL_COMMUNITY): Payer: Self-pay | Admitting: Emergency Medicine

## 2021-10-30 MED ORDER — METRONIDAZOLE 500 MG PO TABS: 500.0000 mg | ORAL_TABLET | Freq: Two times a day (BID) | ORAL | 0 refills | Status: DC

## 2021-10-31 ENCOUNTER — Telehealth (HOSPITAL_COMMUNITY): Payer: Self-pay | Admitting: Emergency Medicine

## 2021-10-31 MED ORDER — METRONIDAZOLE 0.75 % VA GEL: 1.0000 | Freq: Every day | VAGINAL | 0 refills | Status: AC

## 2021-10-31 NOTE — Telephone Encounter (Signed)
Patient preferred Metrogel, resending

## 2022-12-09 IMAGING — DX DG HAND COMPLETE 3+V*L*
3 series · 3 of 3 positions shown · non-contrast
Comparison: None.

CLINICAL DATA: 34-year-old female with left wrist pain.

EXAM:
LEFT WRIST - COMPLETE 3+ VIEW; LEFT HAND - COMPLETE 3+ VIEW

[hand ap]
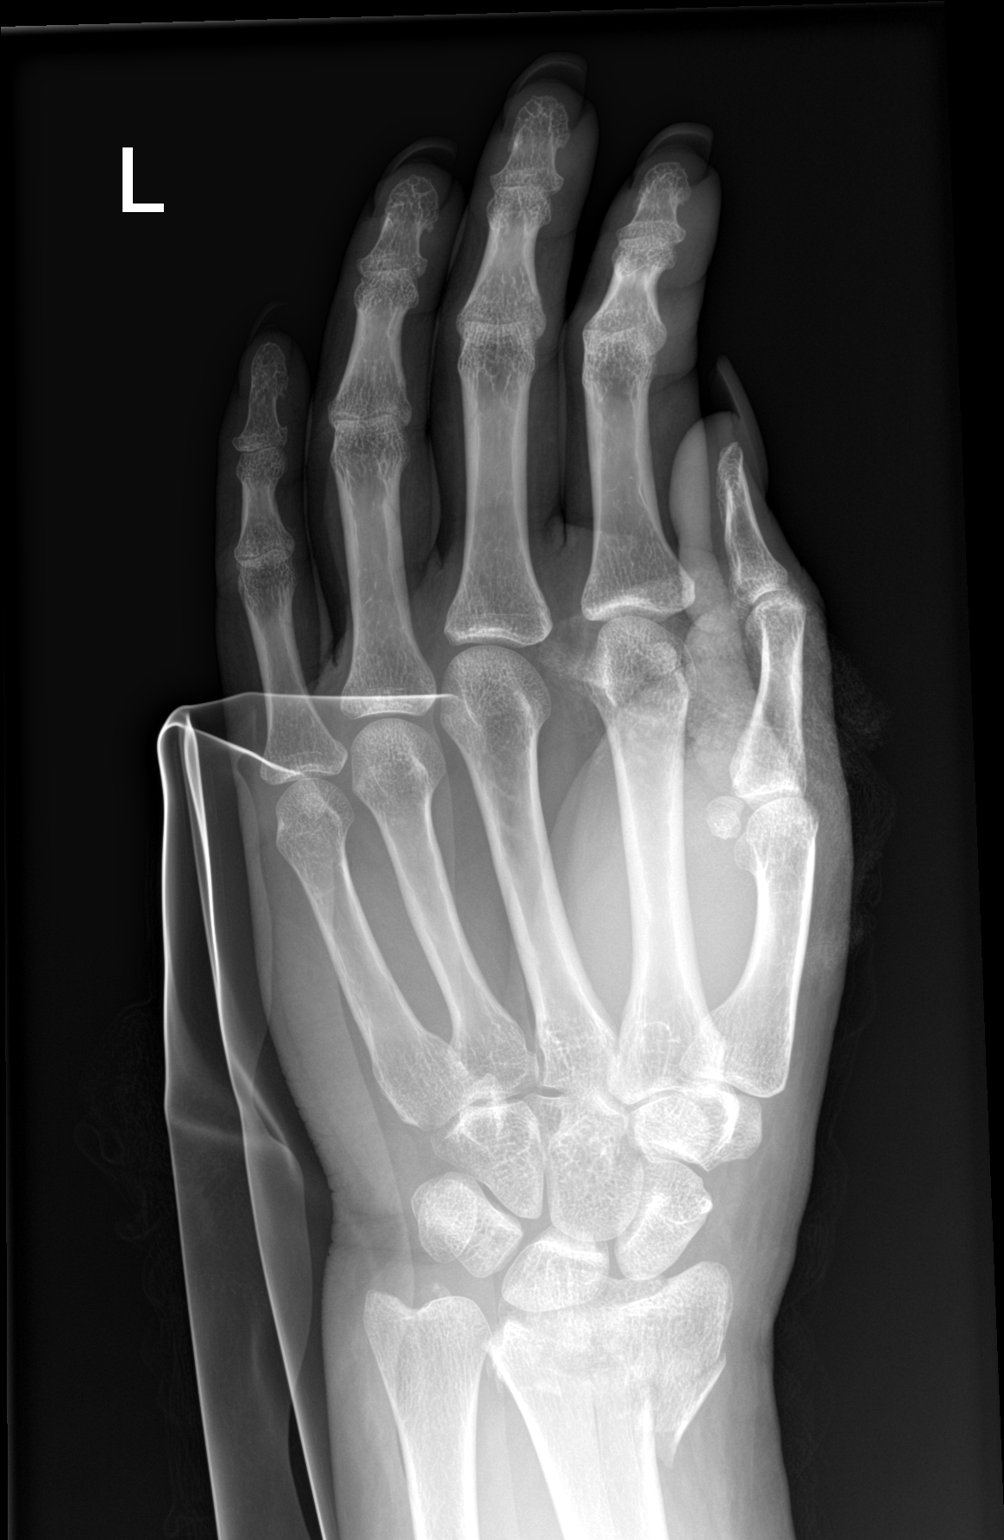

[hand obl]
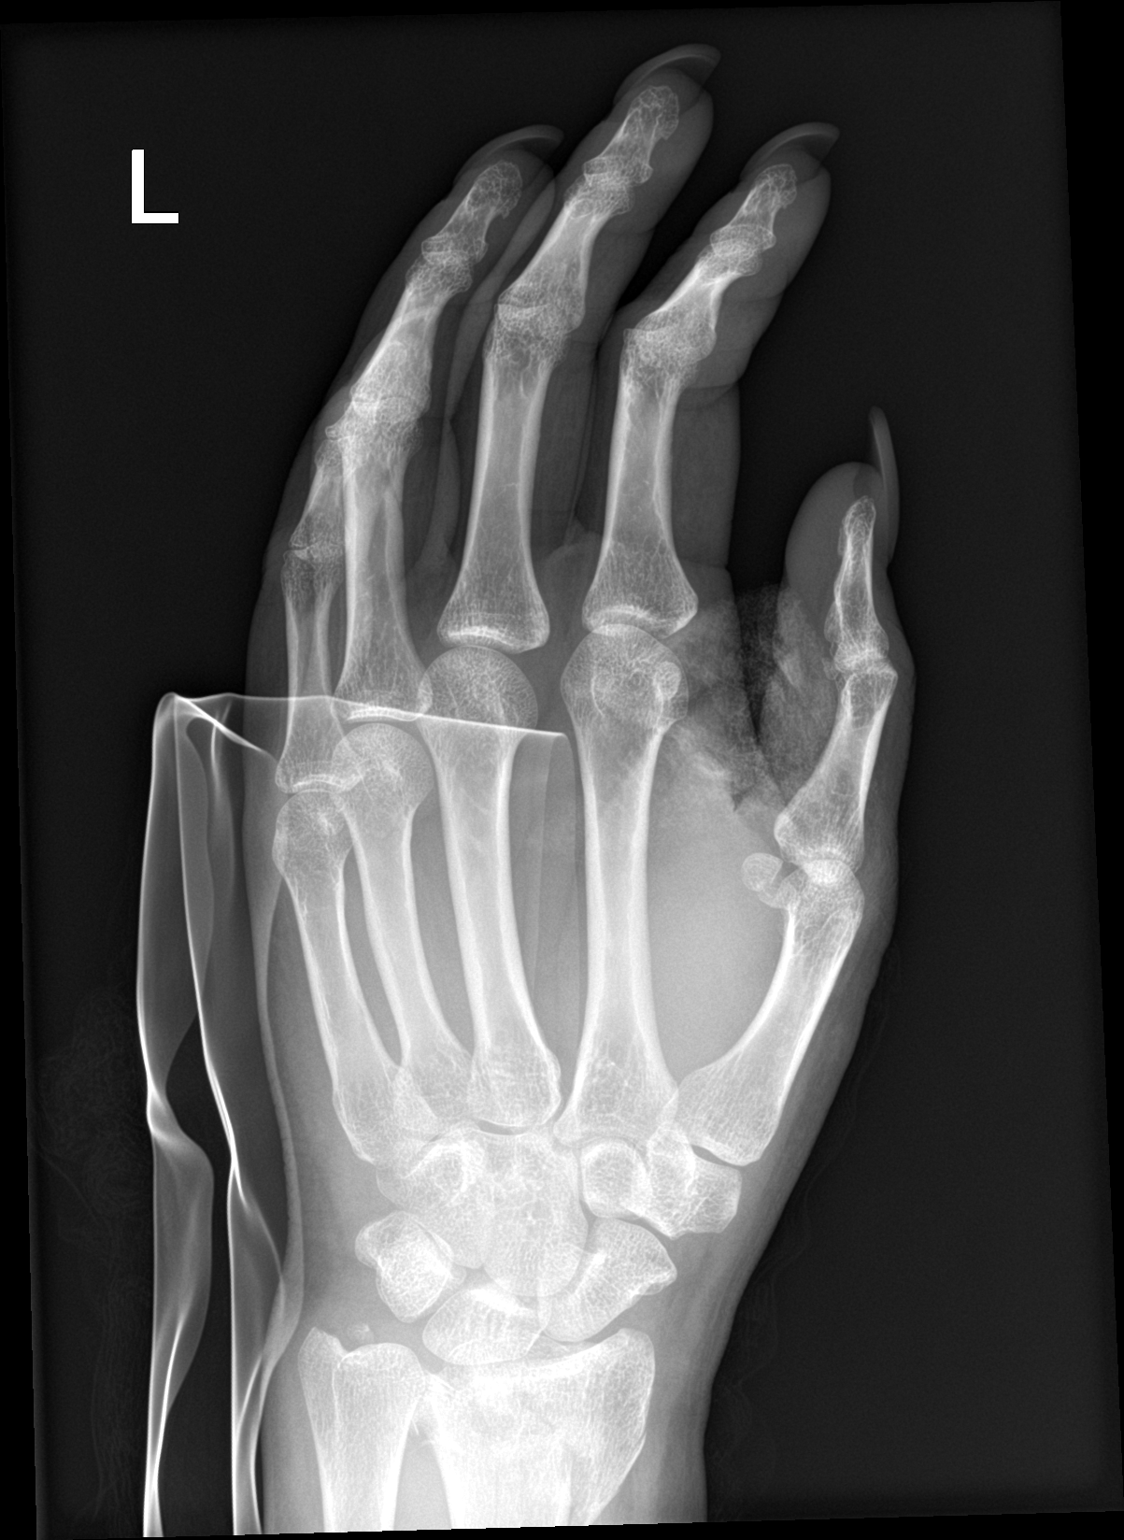

[hand lat]
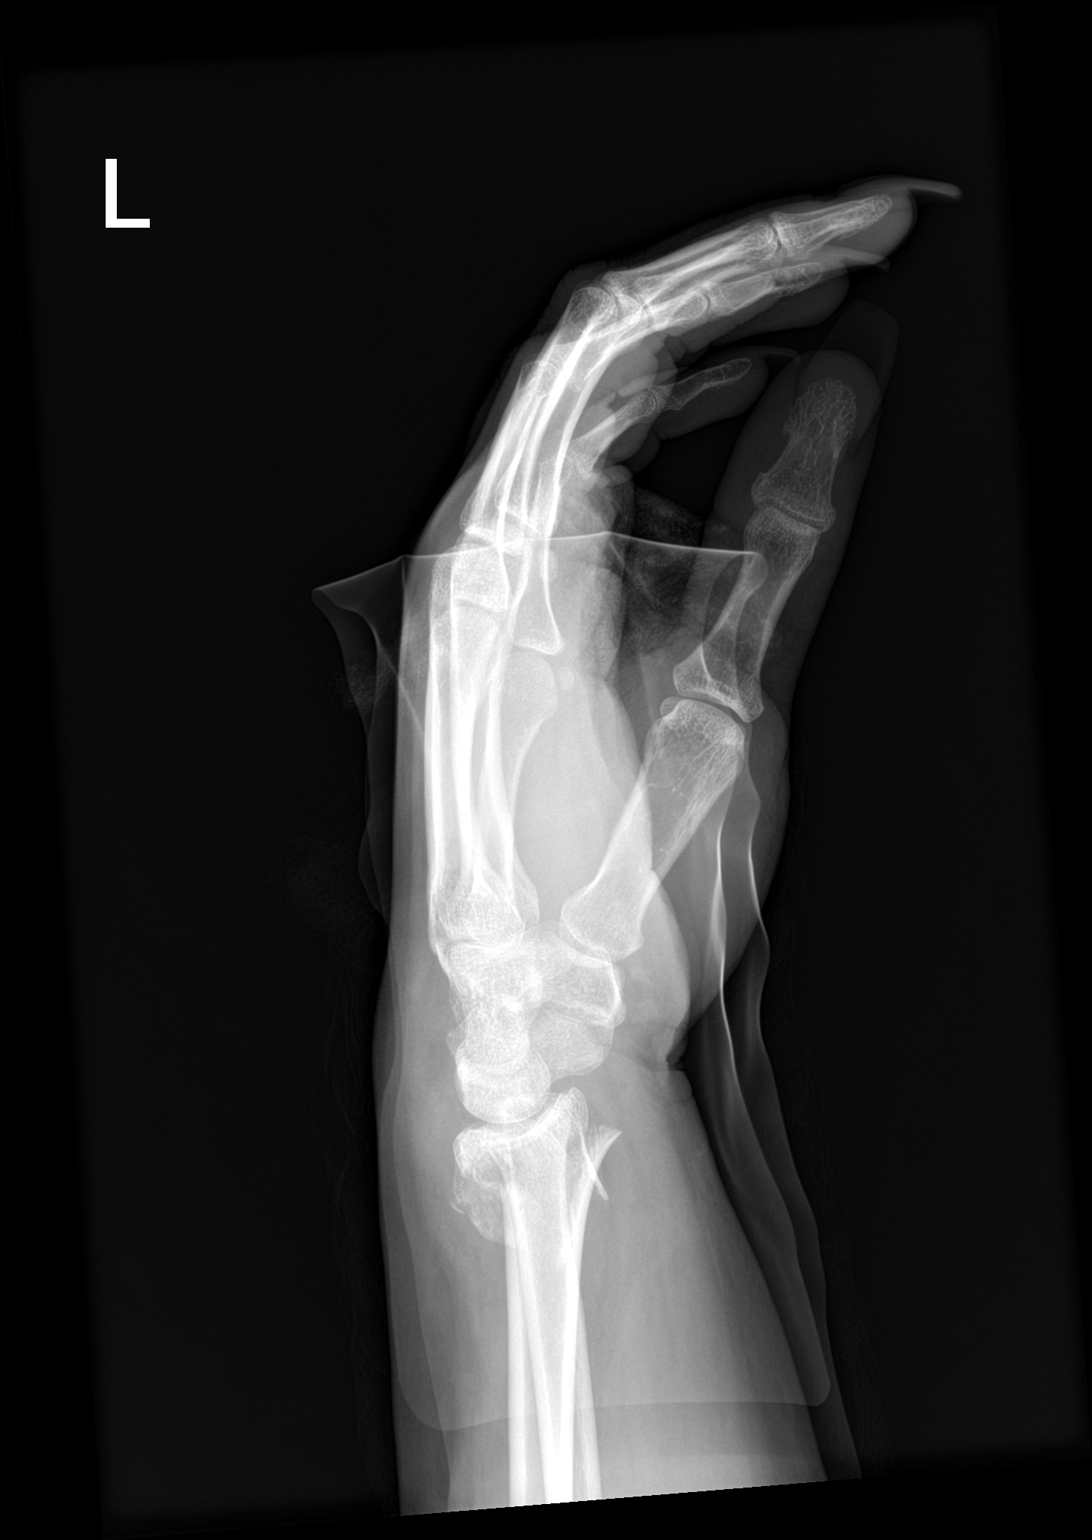

[3 of 3 positions shown; findings below may reference images not displayed]

FINDINGS: There is a comminuted and displaced fracture of the distal radius
with impaction. There is apparent extension of the fracture into the
articular surface. There is mild dorsal angulation of the distal
fracture fragment. There is overall slight foreshortening of the
radius secondary to impaction the radial diaphysis on the
metaphyseal fracture. There is a displaced fracture of the ulnar
styloid. No dislocation. There is soft tissue swelling of the wrist.
IMPRESSION: 1. Comminuted and displaced fracture of the distal radius with
possible intra-articular extension.
2. Displaced fracture of the ulnar styloid.
